# Patient Record
Sex: Male | Born: 1950 | Race: Black or African American | Hispanic: No | Smoking: Current every day smoker
Health system: Southern US, Community
[De-identification: ages and names within clinical notes are randomized; demographics above are authoritative.]

## PROBLEM LIST (undated history)

## (undated) DIAGNOSIS — I10 Essential (primary) hypertension: Secondary | ICD-10-CM

## (undated) DIAGNOSIS — F419 Anxiety disorder, unspecified: Secondary | ICD-10-CM

## (undated) DIAGNOSIS — R51 Headache: Secondary | ICD-10-CM

## (undated) DIAGNOSIS — Z8781 Personal history of (healed) traumatic fracture: Secondary | ICD-10-CM

## (undated) DIAGNOSIS — G8929 Other chronic pain: Secondary | ICD-10-CM

## (undated) DIAGNOSIS — M542 Cervicalgia: Secondary | ICD-10-CM

## (undated) DIAGNOSIS — R0602 Shortness of breath: Secondary | ICD-10-CM

## (undated) DIAGNOSIS — E785 Hyperlipidemia, unspecified: Secondary | ICD-10-CM

## (undated) DIAGNOSIS — M259 Joint disorder, unspecified: Secondary | ICD-10-CM

## (undated) DIAGNOSIS — R569 Unspecified convulsions: Secondary | ICD-10-CM

## (undated) DIAGNOSIS — F32A Depression, unspecified: Secondary | ICD-10-CM

## (undated) DIAGNOSIS — F329 Major depressive disorder, single episode, unspecified: Secondary | ICD-10-CM

## (undated) DIAGNOSIS — M1990 Unspecified osteoarthritis, unspecified site: Secondary | ICD-10-CM

## (undated) DIAGNOSIS — M549 Dorsalgia, unspecified: Secondary | ICD-10-CM

## (undated) HISTORY — DX: Other chronic pain: G89.29

## (undated) HISTORY — DX: Dorsalgia, unspecified: M54.9

## (undated) HISTORY — DX: Essential (primary) hypertension: I10

## (undated) HISTORY — DX: Unspecified osteoarthritis, unspecified site: M19.90

---

## 2006-11-09 ENCOUNTER — Emergency Department: Payer: Self-pay | Admitting: Emergency Medicine

## 2006-11-10 ENCOUNTER — Other Ambulatory Visit: Payer: Self-pay

## 2009-10-06 ENCOUNTER — Emergency Department: Payer: Self-pay | Admitting: Emergency Medicine

## 2012-01-13 ENCOUNTER — Ambulatory Visit: Payer: Self-pay | Admitting: Neurosurgery

## 2012-01-20 ENCOUNTER — Encounter (HOSPITAL_COMMUNITY): Payer: Self-pay | Admitting: Pharmacy Technician

## 2012-01-24 ENCOUNTER — Encounter (HOSPITAL_COMMUNITY): Payer: Self-pay | Admitting: Vascular Surgery

## 2012-01-24 ENCOUNTER — Encounter (HOSPITAL_COMMUNITY): Payer: Self-pay

## 2012-01-24 ENCOUNTER — Encounter (HOSPITAL_COMMUNITY)
Admission: RE | Admit: 2012-01-24 | Discharge: 2012-01-24 | Disposition: A | Discharge status: Home / Self Care | Payer: Medicaid Other | Source: Ambulatory Visit | Attending: Anesthesiology | Admitting: Anesthesiology

## 2012-01-24 ENCOUNTER — Encounter (HOSPITAL_COMMUNITY)
Admission: RE | Admit: 2012-01-24 | Discharge: 2012-01-24 | Disposition: A | Discharge status: Home / Self Care | Payer: Medicaid Other | Source: Ambulatory Visit | Attending: Neurosurgery | Admitting: Neurosurgery

## 2012-01-24 ENCOUNTER — Other Ambulatory Visit: Payer: Self-pay

## 2012-01-24 ENCOUNTER — Other Ambulatory Visit (HOSPITAL_COMMUNITY): Payer: Self-pay | Admitting: *Deleted

## 2012-01-24 DIAGNOSIS — Z01812 Encounter for preprocedural laboratory examination: Secondary | ICD-10-CM | POA: Insufficient documentation

## 2012-01-24 DIAGNOSIS — Z01810 Encounter for preprocedural cardiovascular examination: Secondary | ICD-10-CM | POA: Insufficient documentation

## 2012-01-24 HISTORY — DX: Unspecified convulsions: R56.9

## 2012-01-24 HISTORY — DX: Anxiety disorder, unspecified: F41.9

## 2012-01-24 HISTORY — DX: Shortness of breath: R06.02

## 2012-01-24 HISTORY — DX: Personal history of (healed) traumatic fracture: Z87.81

## 2012-01-24 HISTORY — DX: Depression, unspecified: F32.A

## 2012-01-24 HISTORY — DX: Major depressive disorder, single episode, unspecified: F32.9

## 2012-01-24 LAB — COMPREHENSIVE METABOLIC PANEL
Albumin: 3.9 g/dL (ref 3.5–5.2)
BUN: 11 mg/dL (ref 6–23)
Calcium: 10.1 mg/dL (ref 8.4–10.5)
GFR calc Af Amer: 83 mL/min — ABNORMAL LOW (ref >90)
Glucose, Bld: 85 mg/dL (ref 70–99)
Sodium: 139 mEq/L (ref 135–145)
Total Protein: 7.8 g/dL (ref 6.0–8.3)

## 2012-01-24 LAB — CBC
MCH: 31.6 pg (ref 26.0–34.0)
MCHC: 34.6 g/dL (ref 30.0–36.0)
MCV: 91.5 fL (ref 78.0–100.0)
Platelets: 224 10*3/uL (ref 150–400)
RBC: 4.93 MIL/uL (ref 4.22–5.81)

## 2012-01-24 LAB — TYPE AND SCREEN: ABO/RH(D): A POS

## 2012-01-24 LAB — SURGICAL PCR SCREEN
MRSA, PCR: NEGATIVE
Staphylococcus aureus: NEGATIVE

## 2012-01-24 LAB — ABO/RH: ABO/RH(D): A POS

## 2012-01-24 LAB — DIFFERENTIAL
Basophils Relative: 0 % (ref 0–1)
Eosinophils Absolute: 0.1 10*3/uL (ref 0.0–0.7)
Eosinophils Relative: 1 % (ref 0–5)
Lymphs Abs: 2.1 10*3/uL (ref 0.7–4.0)

## 2012-01-24 MED ORDER — CEFAZOLIN SODIUM 1-5 GM-% IV SOLN: 1.0000 g | INTRAVENOUS | Status: DC

## 2012-01-24 MED ORDER — DEXAMETHASONE SODIUM PHOSPHATE 10 MG/ML IJ SOLN: 10.0000 mg | Freq: Once | INTRAMUSCULAR | Status: DC

## 2012-01-24 NOTE — Pre-Procedure Instructions (Signed)
20 Marque Rademaker  01/24/2012   Your procedure is scheduled on:  Tuesday, February 19th.  Report to [facility] at 7:30 AM.  Call this number if you have problems the morning of surgery: [phone]   Remember:   Do not eat food:After Midnight.  May have clear liquids: up to 4 Hours before arrival.  Clear liquids include soda, tea, black coffee, apple or grape juice, broth.   Take these medicines the morning of surgery with A SIP OF WATER: NA   Do not wear jewelry, make-up or nail polish.  Do not wear lotions, powders, or perfumes. You may wear deodorant.  Do not shave 48 hours prior to surgery.  Do not bring valuables to the hospital.  Contacts, dentures or bridgework may not be worn into surgery.  Leave suitcase in the car. After surgery it may be brought to your room.  For patients admitted to the hospital, checkout time is 11:00 AM the day of discharge.   Patients discharged the day of surgery will not be allowed to drive home.  Name and phone number of your driver: ---  Special Instructions: CHG Shower Use Special Wash: 1/2 bottle night before surgery and 1/2 bottle morning of surgery.   Please read over the following fact sheets that you were given: Pain Booklet, Coughing and Deep Breathing, Blood Transfusion Information, MRSA Information and Surgical Site Infection Prevention

## 2012-01-24 NOTE — Consult Note (Signed)
Anesthesia:  Patient is a 61 year old male scheduled for a C7-T1 laminectomy on 01/25/12.  He is experiencing severe RUE and neck pain.  His PAT appointment was today (01/24/12).  His history includes HTN, SOB, depression, anxiety, seizures, GERD, smoking, and history of near syncope which he says is related to pain.  He also told his PAT nurse that he had experienced some chest pain recently.  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  He denies any prior EKGs or cardiac testing.  His PCP is Dr. Denita Lung at [facility] [phone]).  He describes a episode of sharp left sided chest pain while sitting a couple of weeks ago that last 5-8 seconds.  He had no associated symptoms such as diaphoresis, nausea, palpitations, or pre-syncope at that time.  He has not had a similar pain before or since.  He has no know history of MI/CAD/CHF/DM or family history of CAD.  He is not particularly active due to pain.  He does like to fish.  He can not walk more than 1-2 block before having to rest due to SOB.  This has been his baseline for at least a year.    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  He was last seen by his Neurologist Dr. Pearletha Alfred in July 2012.  He was taken off Dilantin at that time.  Mr. Kimrey denies have any seizures since.  He did report two episodes of pre-syncope within the last three months however, that were associated with pain.  Exam today shows lungs clear, diminished.  Heart with a RRR, no murmur auscultated.  No carotid bruits or LE edema noted.  I reviewed above with Dr. Fran Lowes who recommends further Cardiac work-up pre-operatively.  Erie Noe at Dr. Lindalou Hose office notified.  She will contact Mr. Ohalloran once further evaluation is arranged with thru Dr. Fran Lowes or thru Cardiology referral.  Patient aware.

## 2012-01-25 ENCOUNTER — Ambulatory Visit (HOSPITAL_COMMUNITY): Admission: RE | Admit: 2012-01-25 | Payer: Medicaid Other | Source: Ambulatory Visit | Admitting: Neurosurgery

## 2012-01-25 ENCOUNTER — Encounter (HOSPITAL_COMMUNITY): Admission: RE | Payer: Self-pay | Source: Ambulatory Visit

## 2012-01-25 SURGERY — POSTERIOR CERVICAL LAMINECTOMY
Anesthesia: General | Laterality: Right

## 2012-02-18 ENCOUNTER — Encounter: Payer: Self-pay | Admitting: Cardiology

## 2012-02-23 ENCOUNTER — Ambulatory Visit (INDEPENDENT_AMBULATORY_CARE_PROVIDER_SITE_OTHER): Payer: Medicaid Other | Admitting: Cardiology

## 2012-02-23 ENCOUNTER — Encounter: Payer: Self-pay | Admitting: Cardiology

## 2012-02-23 VITALS — BP 151/86 | HR 62 | Resp 16 | Ht 68.0 in | Wt 150.0 lb

## 2012-02-23 DIAGNOSIS — Z720 Tobacco use: Secondary | ICD-10-CM | POA: Insufficient documentation

## 2012-02-23 DIAGNOSIS — I10 Essential (primary) hypertension: Secondary | ICD-10-CM | POA: Insufficient documentation

## 2012-02-23 DIAGNOSIS — Z01818 Encounter for other preprocedural examination: Secondary | ICD-10-CM | POA: Insufficient documentation

## 2012-02-23 DIAGNOSIS — R9431 Abnormal electrocardiogram [ECG] [EKG]: Secondary | ICD-10-CM | POA: Insufficient documentation

## 2012-02-23 DIAGNOSIS — F17200 Nicotine dependence, unspecified, uncomplicated: Secondary | ICD-10-CM

## 2012-02-23 DIAGNOSIS — R0602 Shortness of breath: Secondary | ICD-10-CM | POA: Insufficient documentation

## 2012-02-23 NOTE — Assessment & Plan Note (Signed)
Blood pressure up today. He states of compliance with his medication. Also discussed diet and sodium restriction.

## 2012-02-23 NOTE — Assessment & Plan Note (Signed)
This may be relatively nonspecific finding - further ischemic workup planned.

## 2012-02-23 NOTE — Assessment & Plan Note (Signed)
Smoking cessation discussed 

## 2012-02-23 NOTE — Assessment & Plan Note (Signed)
Patient with history of hypertension, abnormal ECG as noted, tobacco abuse, some family history of premature CAD. He is being considered for cervical laminectomy with Dr. Jordan Likes. He has had some atypical chest pains and chronic dyspnea on exertion. Plan is to further assess with a Lexiscan Myoview and echocardiogram. Can then make final recommendations regarding surgical risk.

## 2012-02-23 NOTE — Patient Instructions (Signed)
**Note De-Identified Jaelyn Cloninger Obfuscation** Your physician recommends that you continue on your current medications as directed. Please refer to the Current Medication list given to you today.  Your physician has requested that you have an echocardiogram. Echocardiography is a painless test that uses sound waves to create images of your heart. It provides your doctor with information about the size and shape of your heart and how well your heart's chambers and valves are working. This procedure takes approximately one hour. There are no restrictions for this procedure.  Your physician has requested that you have a lexiscan myoview. For further information please visit [email or web address] Please follow instruction sheet, as given.  Your physician recommends that you schedule a follow-up appointment in: we will contact you with results of test.

## 2012-02-23 NOTE — Assessment & Plan Note (Signed)
Chronic by description. Echocardiogram is being arranged.

## 2012-02-23 NOTE — Progress Notes (Signed)
   Clinical Summary Arthur Wilcox is a 61 y.o.male referred for cardiology consultation by Arthur Wilcox. He is undergoing preoperative evaluation prior to cervical laminectomy with Dr. Jordan Likes. Surgery has not yet been scheduled.  He reports no history of CAD or MI. ECG is reviewed below. He does state that he has had some episodes of chest pain over the last 8 months, not exertional, and typically brief. Mild to moderate, sometimes sharp. Also NYHA class 2-3 dyspnea on exertion that is chronic. He has a longstanding tobacco use history.  He has not undergone any prior ischemic testing. He reports compliance with his medications.  No Known Allergies  Current Outpatient Prescriptions  Medication Sig Dispense Refill  . ibuprofen (ADVIL,MOTRIN) 600 MG tablet Take 600 mg by mouth 3 (three) times daily. For pain      . lisinopril (PRINIVIL,ZESTRIL) 10 MG tablet Take 10 mg by mouth daily.        Past Medical History  Diagnosis Date  . Essential hypertension, benign   . Seizures     No recent history by report  . Anxiety   . Depression   . GERD (gastroesophageal reflux disease)   . Osteoarthritis   . History of foot fracture   . Chronic back pain     History reviewed. No pertinent past surgical history.  Family History  Problem Relation Age of Onset  . Hypertension Mother   . Stroke Mother   . Diabetes type II Brother   . Coronary artery disease Brother   . Diabetes type II Sister     Social History Arthur Wilcox reports that he has been smoking Cigarettes and Cigars.  He has smoked for the past 44 years. He has never used smokeless tobacco. Arthur Wilcox reports that he drinks about .6 ounces of alcohol per week.  Review of Systems No palpitations or syncope. No reported bleeding problems. Chronic back pain, limited ambulation. No recent falls or seizures. Sometimes tingling in fingers. Otherwise negative.  Physical Examination Filed Vitals:   02/23/12 1014  BP: 151/86  Pulse: 62  Resp: 16     Normally nourished appearing, NAD. HEENT: Conjunctiva and lids normal, oropharynx clear. Neck: Supple, no elevated JVP or carotid bruits, no thyromegaly. Lungs: Clear to auscultation, nonlabored breathing at rest. Cardiac: Regular rate and rhythm, somewhat distant, no S3 with soft systolic murmur, no pericardial rub. Abdomen: Soft, nontender, bowel sounds present, no guarding or rebound. Extremities: No pitting edema, distal pulses 1-2+. Skin: Warm and dry. Musculoskeletal: No kyphosis. Neuropsychiatric: Alert and oriented x3, affect grossly appropriate.   ECG Normal sinus rhythm with possible anteroseptal Q waves.    Problem List and Plan

## 2012-02-29 ENCOUNTER — Encounter (HOSPITAL_COMMUNITY)
Admission: RE | Admit: 2012-02-29 | Discharge: 2012-02-29 | Disposition: A | Discharge status: Home / Self Care | Payer: Medicaid Other | Source: Ambulatory Visit | Attending: Cardiology | Admitting: Cardiology

## 2012-02-29 ENCOUNTER — Ambulatory Visit (HOSPITAL_COMMUNITY)
Admission: RE | Admit: 2012-02-29 | Discharge: 2012-02-29 | Disposition: A | Discharge status: Home / Self Care | Payer: Medicaid Other | Source: Ambulatory Visit | Attending: Cardiology | Admitting: Cardiology

## 2012-02-29 ENCOUNTER — Ambulatory Visit (INDEPENDENT_AMBULATORY_CARE_PROVIDER_SITE_OTHER): Payer: Medicaid Other

## 2012-02-29 DIAGNOSIS — I059 Rheumatic mitral valve disease, unspecified: Secondary | ICD-10-CM

## 2012-02-29 DIAGNOSIS — R079 Chest pain, unspecified: Secondary | ICD-10-CM | POA: Insufficient documentation

## 2012-02-29 DIAGNOSIS — R9431 Abnormal electrocardiogram [ECG] [EKG]: Secondary | ICD-10-CM | POA: Insufficient documentation

## 2012-02-29 DIAGNOSIS — R0602 Shortness of breath: Secondary | ICD-10-CM

## 2012-02-29 DIAGNOSIS — I10 Essential (primary) hypertension: Secondary | ICD-10-CM | POA: Insufficient documentation

## 2012-02-29 DIAGNOSIS — R0609 Other forms of dyspnea: Secondary | ICD-10-CM | POA: Insufficient documentation

## 2012-02-29 DIAGNOSIS — R0989 Other specified symptoms and signs involving the circulatory and respiratory systems: Secondary | ICD-10-CM | POA: Insufficient documentation

## 2012-02-29 DIAGNOSIS — Z01810 Encounter for preprocedural cardiovascular examination: Secondary | ICD-10-CM | POA: Insufficient documentation

## 2012-02-29 DIAGNOSIS — Z01818 Encounter for other preprocedural examination: Secondary | ICD-10-CM

## 2012-02-29 DIAGNOSIS — F17200 Nicotine dependence, unspecified, uncomplicated: Secondary | ICD-10-CM | POA: Insufficient documentation

## 2012-02-29 MED ORDER — TECHNETIUM TC 99M TETROFOSMIN IV KIT
30.0000 | PACK | Freq: Once | INTRAVENOUS | Status: AC | PRN
  Administered 2012-02-29: 32 via INTRAVENOUS

## 2012-02-29 MED ORDER — TECHNETIUM TC 99M TETROFOSMIN IV KIT
10.0000 | PACK | Freq: Once | INTRAVENOUS | Status: AC | PRN
  Administered 2012-02-29: 10.5 via INTRAVENOUS

## 2012-02-29 NOTE — Progress Notes (Signed)
Stress Lab Nurses Notes - Layth Cerezo 02/29/2012  Reason for doing test: Chest Pain and Surgical Clearance Type of test: Steffanie Dunn Nurse performing test: Parke Poisson, RN Nuclear Medicine Tech: Lou Cal Echo Tech: Not Applicable MD performing test: R. Rothbart Family MD: Fran Lowes Test explained and consent signed: yes IV started: 22g jelco, Saline lock flushed, No redness or edema and Saline lock started in radiology Symptoms: Dizziness Treatment/Intervention: None Reason test stopped: protocol completed After recovery IV was: Discontinued via X-ray tech and No redness or edema Patient to return to Nuc. Med at : 12:30 Patient discharged: Home Patient's Condition upon discharge was: stable Comments: During test BP 118/62 & HR 86.  Recovery BP 120/68 & HR 70.  Symptoms resolved in recovery. Erskine Speed T

## 2012-02-29 NOTE — Progress Notes (Signed)
*  PRELIMINARY RESULTS* Echocardiogram 2D Echocardiogram has been performed.  Arthur Wilcox 02/29/2012, 8:07 AM

## 2012-03-31 ENCOUNTER — Other Ambulatory Visit: Payer: Self-pay | Admitting: Neurosurgery

## 2012-04-04 ENCOUNTER — Encounter (HOSPITAL_COMMUNITY): Payer: Self-pay | Admitting: Pharmacy Technician

## 2012-04-10 ENCOUNTER — Encounter (HOSPITAL_COMMUNITY)
Admission: RE | Admit: 2012-04-10 | Discharge: 2012-04-10 | Disposition: A | Discharge status: Home / Self Care | Payer: Medicaid Other | Source: Ambulatory Visit | Attending: Neurosurgery | Admitting: Neurosurgery

## 2012-04-10 ENCOUNTER — Encounter (HOSPITAL_COMMUNITY): Payer: Self-pay

## 2012-04-10 HISTORY — DX: Other chronic pain: G89.29

## 2012-04-10 HISTORY — DX: Headache: R51

## 2012-04-10 HISTORY — DX: Cervicalgia: M54.2

## 2012-04-10 HISTORY — DX: Hyperlipidemia, unspecified: E78.5

## 2012-04-10 HISTORY — DX: Joint disorder, unspecified: M25.9

## 2012-04-10 LAB — TYPE AND SCREEN
ABO/RH(D): A POS
Antibody Screen: NEGATIVE

## 2012-04-10 LAB — DIFFERENTIAL
Basophils Absolute: 0 10*3/uL (ref 0.0–0.1)
Basophils Relative: 0 % (ref 0–1)
Eosinophils Absolute: 0.1 10*3/uL (ref 0.0–0.7)
Eosinophils Relative: 1 % (ref 0–5)
Lymphocytes Relative: 36 % (ref 12–46)
Lymphs Abs: 2.1 10*3/uL (ref 0.7–4.0)
Monocytes Absolute: 0.4 10*3/uL (ref 0.1–1.0)
Monocytes Relative: 7 % (ref 3–12)
Neutro Abs: 3.2 10*3/uL (ref 1.7–7.7)
Neutrophils Relative %: 56 % (ref 43–77)

## 2012-04-10 LAB — COMPREHENSIVE METABOLIC PANEL
ALT: 9 U/L (ref 0–53)
AST: 18 U/L (ref 0–37)
Albumin: 3.7 g/dL (ref 3.5–5.2)
Alkaline Phosphatase: 63 U/L (ref 39–117)
CO2: 28 mEq/L (ref 19–32)
Chloride: 103 mEq/L (ref 96–112)
Creatinine, Ser: 1.07 mg/dL (ref 0.50–1.35)
GFR calc non Af Amer: 75 mL/min — ABNORMAL LOW (ref >90)
Potassium: 4.6 mEq/L (ref 3.5–5.1)
Sodium: 139 mEq/L (ref 135–145)
Total Bilirubin: 0.2 mg/dL — ABNORMAL LOW (ref 0.3–1.2)

## 2012-04-10 LAB — CBC
HCT: 42 % (ref 39.0–52.0)
Hemoglobin: 14.4 g/dL (ref 13.0–17.0)
MCH: 31.4 pg (ref 26.0–34.0)
MCHC: 34.3 g/dL (ref 30.0–36.0)
MCV: 91.7 fL (ref 78.0–100.0)
Platelets: 228 10*3/uL (ref 150–400)
RBC: 4.58 MIL/uL (ref 4.22–5.81)
RDW: 13.5 % (ref 11.5–15.5)
WBC: 5.8 10*3/uL (ref 4.0–10.5)

## 2012-04-10 LAB — SURGICAL PCR SCREEN
MRSA, PCR: NEGATIVE
Staphylococcus aureus: NEGATIVE

## 2012-04-10 NOTE — Pre-Procedure Instructions (Signed)
20 Arthur Wilcox  04/10/2012   Your procedure is scheduled on: Tues, May 14 @ 11:00 AM  Report to [facility] at 9:00 AM.  Call this number if you have problems the morning of surgery: [phone]   Remember:   Do not eat food:After Midnight.  May have clear liquids: up to 4 Hours before arrival.(until 5:00 am)  Clear liquids include soda, tea, black coffee, apple or grape juice, broth.    Do not wear jewelry  Do not wear lotions, powders, or perfumes  Do not bring valuables to the hospital.  Contacts, dentures or bridgework may not be worn into surgery.  Leave suitcase in the car. After surgery it may be brought to your room.  For patients admitted to the hospital, checkout time is 11:00 AM the day of discharge.   Special Instructions: CHG Shower Use Special Wash: 1/2 bottle night before surgery and 1/2 bottle morning of surgery.   Please read over the following fact sheets that you were given: Pain Booklet, Coughing and Deep Breathing, Blood Transfusion Information, MRSA Information and Surgical Site Infection Prevention

## 2012-04-10 NOTE — Consult Note (Signed)
Anesthesia Chart Review:  Patient is a 61 year old male scheduled for a right C7-T1 laminectomy, foraminotomy, microdiskectomy on 04/18/12.  He was initially scheduled for this procedure on 01/25/12, but during his PAT visit he reported episodes of left sided chest pain and presyncope related to severe RUE and neck pain.  I reviewed this with his PCP Dr. Denita Lung who agreed that surgery should be postponed until he could be evaluated by Cardiology.  Since that visit, he has been evaluated by Cardiologist Dr. Diona Browner on 02/23/12 who recommended a stress and echo.  Echo on 02/29/12 showed:   - Left ventricle: The cavity size was normal. Wall thickness was increased in a pattern of mild LVH. Systolic function was normal. The estimated ejection fraction was in the range of 50% to 55%. Wall motion was normal; there were no regional wall motion abnormalities. Doppler parameters are consistent with abnormal left ventricular relaxation (grade 1 diastolic dysfunction). - Aortic valve: Trileaflet; mildly thickened leaflets. - Mitral valve: Mildly thickened leaflets . Mild regurgitation. - Tricuspid valve: Trivial regurgitation. - Pericardium, extracardiac: There was no pericardial Effusion.  His nuclear stress test impression from 02/29/12 was: "Probably negative pharmacologic stress nuclear myocardial study revealing no stress induced EKG abnormalities, normal left ventricular size and mild global reduction in left ventricular systolic function. By scintigraphic imaging, data from visual image analysis conflicted with that obtained from numeric assessment. Apparent perfusion abnormalities probably reflect technical issues concerning image normalization; however, the possibility of mild ischemia in the distribution of the left anterior descending coronary artery cannot be unequivocally excluded."  EF was 43%.  This was ultimately reviewed by Dr. Diona Browner and felt patient was at acceptable risk for surgery.   (See his comments under Results Review tab.)  EKG on 02/23/12 showed NSR, possible septal infarct (age undetermined).  He had a negative CXR on 01/24/12.  Labs noted.  Anticipate he can proceed if no significant change in his status.  Shonna Chock, PA-C    Other history includes HTN, SOB, depression, anxiety, GERD, OA, migraines, chronic neck and back pain, HLD, smoking.  He also has a history of seizures. When I spoke with him in February 2013 he reported that his history of seizures starting around age 53 after a "sun stroke". He was last seen by his Neurologist Dr. Pearletha Alfred in July 2012. He was taken off Dilantin at that time and denied any seizures since.   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 He denies any prior EKGs or cardiac testing. His PCP is Dr. Denita Lung at [facility] [phone]).  He describes a episode of sharp left sided chest pain while sitting a couple of weeks ago that last 5-8 seconds. He had no associated symptoms such as diaphoresis, nausea, palpitations, or pre-syncope at that time. He has not had a similar pain before or since. He has no know history of MI/CAD/CHF/DM or family history of CAD. He is not particularly active due to pain. He does like to fish. He can not walk more than 1-2 block before having to rest due to SOB. This has been his baseline for at least a year.  He also reports a  He did report two episodes of pre-syncope within the last three months however, that were associated with pain.

## 2012-04-10 NOTE — Progress Notes (Signed)
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

## 2012-04-10 NOTE — Progress Notes (Signed)
Right lower leg scabbed over from a tick bite a week ago

## 2012-04-17 MED ORDER — CEFAZOLIN SODIUM 1-5 GM-% IV SOLN
1.0000 g | INTRAVENOUS | Status: AC
  Administered 2012-04-18: 1 g via INTRAVENOUS
  Filled 2012-04-17: qty 50

## 2012-04-18 ENCOUNTER — Encounter (HOSPITAL_COMMUNITY)
Admission: RE | Disposition: A | Discharge status: Home / Self Care | Payer: Self-pay | Source: Ambulatory Visit | Attending: Neurosurgery

## 2012-04-18 ENCOUNTER — Ambulatory Visit (HOSPITAL_COMMUNITY)
Admission: RE | Admit: 2012-04-18 | Discharge: 2012-04-19 | Disposition: A | Discharge status: Home / Self Care | Payer: Medicaid Other | Source: Ambulatory Visit | Attending: Neurosurgery | Admitting: Neurosurgery

## 2012-04-18 ENCOUNTER — Ambulatory Visit (HOSPITAL_COMMUNITY): Payer: Medicaid Other

## 2012-04-18 ENCOUNTER — Encounter (HOSPITAL_COMMUNITY): Payer: Self-pay | Admitting: *Deleted

## 2012-04-18 ENCOUNTER — Ambulatory Visit (HOSPITAL_COMMUNITY): Payer: Medicaid Other | Admitting: Vascular Surgery

## 2012-04-18 ENCOUNTER — Encounter (HOSPITAL_COMMUNITY): Payer: Self-pay | Admitting: Vascular Surgery

## 2012-04-18 DIAGNOSIS — F17200 Nicotine dependence, unspecified, uncomplicated: Secondary | ICD-10-CM | POA: Insufficient documentation

## 2012-04-18 DIAGNOSIS — Z01812 Encounter for preprocedural laboratory examination: Secondary | ICD-10-CM | POA: Insufficient documentation

## 2012-04-18 DIAGNOSIS — M5010 Cervical disc disorder with radiculopathy, unspecified cervical region: Secondary | ICD-10-CM | POA: Diagnosis present

## 2012-04-18 DIAGNOSIS — M5020 Other cervical disc displacement, unspecified cervical region: Secondary | ICD-10-CM | POA: Insufficient documentation

## 2012-04-18 DIAGNOSIS — M129 Arthropathy, unspecified: Secondary | ICD-10-CM | POA: Insufficient documentation

## 2012-04-18 DIAGNOSIS — I10 Essential (primary) hypertension: Secondary | ICD-10-CM | POA: Insufficient documentation

## 2012-04-18 DIAGNOSIS — M1990 Unspecified osteoarthritis, unspecified site: Secondary | ICD-10-CM | POA: Insufficient documentation

## 2012-04-18 DIAGNOSIS — K08109 Complete loss of teeth, unspecified cause, unspecified class: Secondary | ICD-10-CM | POA: Insufficient documentation

## 2012-04-18 HISTORY — PX: POSTERIOR CERVICAL LAMINECTOMY: SHX2248

## 2012-04-18 SURGERY — POSTERIOR CERVICAL LAMINECTOMY
Anesthesia: General | Site: Spine Cervical | Laterality: Right | Wound class: Clean

## 2012-04-18 MED ORDER — OXYCODONE-ACETAMINOPHEN 5-325 MG PO TABS
1.0000 | ORAL_TABLET | ORAL | Status: DC | PRN
  Administered 2012-04-18 – 2012-04-19 (×2): 2 via ORAL
  Filled 2012-04-18 (×2): qty 2

## 2012-04-18 MED ORDER — THROMBIN 5000 UNITS EX KIT
PACK | CUTANEOUS | Status: DC | PRN
  Administered 2012-04-18 (×2): 5000 [IU] via TOPICAL

## 2012-04-18 MED ORDER — HYDROMORPHONE HCL PF 1 MG/ML IJ SOLN
INTRAMUSCULAR | Status: AC
  Filled 2012-04-18: qty 1

## 2012-04-18 MED ORDER — MENTHOL 3 MG MT LOZG: 1.0000 | LOZENGE | OROMUCOSAL | Status: DC | PRN

## 2012-04-18 MED ORDER — SENNA 8.6 MG PO TABS
1.0000 | ORAL_TABLET | Freq: Two times a day (BID) | ORAL | Status: DC
  Administered 2012-04-18 – 2012-04-19 (×2): 8.6 mg via ORAL
  Filled 2012-04-18 (×3): qty 1

## 2012-04-18 MED ORDER — LISINOPRIL 10 MG PO TABS
10.0000 mg | ORAL_TABLET | Freq: Every day | ORAL | Status: DC
  Administered 2012-04-19: 10 mg via ORAL
  Filled 2012-04-18: qty 1

## 2012-04-18 MED ORDER — CYCLOBENZAPRINE HCL 10 MG PO TABS
10.0000 mg | ORAL_TABLET | Freq: Three times a day (TID) | ORAL | Status: DC | PRN
  Administered 2012-04-18: 10 mg via ORAL
  Filled 2012-04-18: qty 1

## 2012-04-18 MED ORDER — DEXAMETHASONE SODIUM PHOSPHATE 10 MG/ML IJ SOLN: 10.0000 mg | INTRAMUSCULAR | Status: DC

## 2012-04-18 MED ORDER — SODIUM CHLORIDE 0.9 % IJ SOLN: 3.0000 mL | INTRAMUSCULAR | Status: DC | PRN

## 2012-04-18 MED ORDER — PHENOL 1.4 % MT LIQD: 1.0000 | OROMUCOSAL | Status: DC | PRN

## 2012-04-18 MED ORDER — GLYCOPYRROLATE 0.2 MG/ML IJ SOLN
INTRAMUSCULAR | Status: DC | PRN
  Administered 2012-04-18: .5 mg via INTRAVENOUS

## 2012-04-18 MED ORDER — ALUM & MAG HYDROXIDE-SIMETH 200-200-20 MG/5ML PO SUSP: 30.0000 mL | Freq: Four times a day (QID) | ORAL | Status: DC | PRN

## 2012-04-18 MED ORDER — LACTATED RINGERS IV SOLN
INTRAVENOUS | Status: DC | PRN
  Administered 2012-04-18 (×2): via INTRAVENOUS

## 2012-04-18 MED ORDER — SODIUM CHLORIDE 0.9 % IR SOLN
Status: DC | PRN
  Administered 2012-04-18: 14:00:00

## 2012-04-18 MED ORDER — PROMETHAZINE HCL 25 MG/ML IJ SOLN: 6.2500 mg | INTRAMUSCULAR | Status: DC | PRN

## 2012-04-18 MED ORDER — VECURONIUM BROMIDE 10 MG IV SOLR
INTRAVENOUS | Status: DC | PRN
  Administered 2012-04-18: 9 mg via INTRAVENOUS

## 2012-04-18 MED ORDER — NEOSTIGMINE METHYLSULFATE 1 MG/ML IJ SOLN
INTRAMUSCULAR | Status: DC | PRN
  Administered 2012-04-18: 3 mg via INTRAVENOUS

## 2012-04-18 MED ORDER — BACITRACIN 50000 UNITS IM SOLR
INTRAMUSCULAR | Status: AC
  Filled 2012-04-18: qty 1

## 2012-04-18 MED ORDER — BISACODYL 10 MG RE SUPP: 10.0000 mg | Freq: Every day | RECTAL | Status: DC | PRN

## 2012-04-18 MED ORDER — SODIUM CHLORIDE 0.9 % IV SOLN
INTRAVENOUS | Status: AC
  Filled 2012-04-18: qty 500

## 2012-04-18 MED ORDER — ZOLPIDEM TARTRATE 5 MG PO TABS: 10.0000 mg | ORAL_TABLET | Freq: Every evening | ORAL | Status: DC | PRN

## 2012-04-18 MED ORDER — KETOROLAC TROMETHAMINE 30 MG/ML IJ SOLN
30.0000 mg | Freq: Four times a day (QID) | INTRAMUSCULAR | Status: DC
  Administered 2012-04-18 – 2012-04-19 (×3): 30 mg via INTRAVENOUS
  Filled 2012-04-18 (×7): qty 1

## 2012-04-18 MED ORDER — SODIUM CHLORIDE 0.9 % IV SOLN: 250.0000 mL | INTRAVENOUS | Status: DC

## 2012-04-18 MED ORDER — ONDANSETRON HCL 4 MG/2ML IJ SOLN: 4.0000 mg | INTRAMUSCULAR | Status: DC | PRN

## 2012-04-18 MED ORDER — FLEET ENEMA 7-19 GM/118ML RE ENEM
1.0000 | ENEMA | Freq: Once | RECTAL | Status: AC | PRN
  Filled 2012-04-18: qty 1

## 2012-04-18 MED ORDER — MIDAZOLAM HCL 5 MG/5ML IJ SOLN
INTRAMUSCULAR | Status: DC | PRN
  Administered 2012-04-18: 2 mg via INTRAVENOUS

## 2012-04-18 MED ORDER — MEPERIDINE HCL 25 MG/ML IJ SOLN: 6.2500 mg | INTRAMUSCULAR | Status: DC | PRN

## 2012-04-18 MED ORDER — MIDAZOLAM HCL 2 MG/2ML IJ SOLN: 0.5000 mg | Freq: Once | INTRAMUSCULAR | Status: DC | PRN

## 2012-04-18 MED ORDER — HEMOSTATIC AGENTS (NO CHARGE) OPTIME
TOPICAL | Status: DC | PRN
  Administered 2012-04-18: 1 via TOPICAL

## 2012-04-18 MED ORDER — 0.9 % SODIUM CHLORIDE (POUR BTL) OPTIME
TOPICAL | Status: DC | PRN
  Administered 2012-04-18: 1000 mL

## 2012-04-18 MED ORDER — BUPIVACAINE HCL (PF) 0.25 % IJ SOLN
INTRAMUSCULAR | Status: DC | PRN
  Administered 2012-04-18: 20 mL

## 2012-04-18 MED ORDER — BACITRACIN ZINC 500 UNIT/GM EX OINT
TOPICAL_OINTMENT | CUTANEOUS | Status: DC | PRN
  Administered 2012-04-18: 1 via TOPICAL

## 2012-04-18 MED ORDER — ONDANSETRON HCL 4 MG/2ML IJ SOLN
INTRAMUSCULAR | Status: DC | PRN
  Administered 2012-04-18: 4 mg via INTRAVENOUS

## 2012-04-18 MED ORDER — HYDROMORPHONE HCL PF 1 MG/ML IJ SOLN
0.2500 mg | INTRAMUSCULAR | Status: DC | PRN
  Administered 2012-04-18 (×4): 0.5 mg via INTRAVENOUS

## 2012-04-18 MED ORDER — CEFAZOLIN SODIUM 1-5 GM-% IV SOLN
1.0000 g | Freq: Three times a day (TID) | INTRAVENOUS | Status: AC
  Administered 2012-04-18 – 2012-04-19 (×2): 1 g via INTRAVENOUS
  Filled 2012-04-18 (×2): qty 50

## 2012-04-18 MED ORDER — ACETAMINOPHEN 650 MG RE SUPP: 650.0000 mg | RECTAL | Status: DC | PRN

## 2012-04-18 MED ORDER — SODIUM CHLORIDE 0.9 % IJ SOLN
3.0000 mL | Freq: Two times a day (BID) | INTRAMUSCULAR | Status: DC
  Administered 2012-04-18: 3 mL via INTRAVENOUS

## 2012-04-18 MED ORDER — PROPOFOL 10 MG/ML IV EMUL
INTRAVENOUS | Status: DC | PRN
  Administered 2012-04-18: 150 mg via INTRAVENOUS
  Administered 2012-04-18: 50 mg via INTRAVENOUS

## 2012-04-18 MED ORDER — HYDROCODONE-ACETAMINOPHEN 5-325 MG PO TABS: 1.0000 | ORAL_TABLET | ORAL | Status: DC | PRN

## 2012-04-18 MED ORDER — HYDROMORPHONE HCL PF 1 MG/ML IJ SOLN: 0.5000 mg | INTRAMUSCULAR | Status: DC | PRN

## 2012-04-18 MED ORDER — POLYETHYLENE GLYCOL 3350 17 G PO PACK
17.0000 g | PACK | Freq: Every day | ORAL | Status: DC | PRN
  Filled 2012-04-18: qty 1

## 2012-04-18 MED ORDER — SUFENTANIL CITRATE 50 MCG/ML IV SOLN
INTRAVENOUS | Status: DC | PRN
  Administered 2012-04-18: 20 ug via INTRAVENOUS
  Administered 2012-04-18 (×2): 10 ug via INTRAVENOUS

## 2012-04-18 MED ORDER — ACETAMINOPHEN 325 MG PO TABS: 650.0000 mg | ORAL_TABLET | ORAL | Status: DC | PRN

## 2012-04-18 MED ORDER — DEXAMETHASONE SODIUM PHOSPHATE 10 MG/ML IJ SOLN
INTRAMUSCULAR | Status: AC
  Administered 2012-04-18: 10 mg via INTRAVENOUS
  Filled 2012-04-18: qty 1

## 2012-04-18 MED ORDER — KETOROLAC TROMETHAMINE 30 MG/ML IJ SOLN
INTRAMUSCULAR | Status: DC | PRN
  Administered 2012-04-18: 30 mg via INTRAVENOUS

## 2012-04-18 SURGICAL SUPPLY — 55 items
BAG DECANTER FOR FLEXI CONT (MISCELLANEOUS) ×2 IMPLANT
BENZOIN TINCTURE PRP APPL 2/3 (GAUZE/BANDAGES/DRESSINGS) ×2 IMPLANT
BLADE SURG ROTATE 9660 (MISCELLANEOUS) ×2 IMPLANT
BRUSH SCRUB EZ PLAIN DRY (MISCELLANEOUS) ×2 IMPLANT
BUR MATCHSTICK NEURO 3.0 LAGG (BURR) ×2 IMPLANT
CANISTER SUCTION 2500CC (MISCELLANEOUS) ×2 IMPLANT
CLOTH BEACON ORANGE TIMEOUT ST (SAFETY) ×2 IMPLANT
CONT SPEC 4OZ CLIKSEAL STRL BL (MISCELLANEOUS) ×2 IMPLANT
DERMABOND ADHESIVE PROPEN (GAUZE/BANDAGES/DRESSINGS) ×1
DERMABOND ADVANCED (GAUZE/BANDAGES/DRESSINGS)
DERMABOND ADVANCED .7 DNX12 (GAUZE/BANDAGES/DRESSINGS) IMPLANT
DERMABOND ADVANCED .7 DNX6 (GAUZE/BANDAGES/DRESSINGS) ×1 IMPLANT
DRAPE LAPAROTOMY 100X72 PEDS (DRAPES) ×2 IMPLANT
DRAPE MICROSCOPE ZEISS OPMI (DRAPES) ×2 IMPLANT
DRAPE POUCH INSTRU U-SHP 10X18 (DRAPES) ×2 IMPLANT
ELECT REM PT RETURN 9FT ADLT (ELECTROSURGICAL) ×2
ELECTRODE REM PT RTRN 9FT ADLT (ELECTROSURGICAL) ×1 IMPLANT
GAUZE SPONGE 4X4 16PLY XRAY LF (GAUZE/BANDAGES/DRESSINGS) IMPLANT
GLOVE BIOGEL PI IND STRL 7.0 (GLOVE) ×1 IMPLANT
GLOVE BIOGEL PI IND STRL 8.5 (GLOVE) ×1 IMPLANT
GLOVE BIOGEL PI INDICATOR 7.0 (GLOVE) ×1
GLOVE BIOGEL PI INDICATOR 8.5 (GLOVE) ×1
GLOVE ECLIPSE 8.5 STRL (GLOVE) ×4 IMPLANT
GLOVE EXAM NITRILE LRG STRL (GLOVE) IMPLANT
GLOVE EXAM NITRILE MD LF STRL (GLOVE) IMPLANT
GLOVE EXAM NITRILE XL STR (GLOVE) IMPLANT
GLOVE EXAM NITRILE XS STR PU (GLOVE) IMPLANT
GLOVE SS BIOGEL STRL SZ 6.5 (GLOVE) ×1 IMPLANT
GLOVE SUPERSENSE BIOGEL SZ 6.5 (GLOVE) ×1
GLOVE SURG SS PI 6.5 STRL IVOR (GLOVE) ×4 IMPLANT
GOWN BRE IMP SLV AUR LG STRL (GOWN DISPOSABLE) ×2 IMPLANT
GOWN BRE IMP SLV AUR XL STRL (GOWN DISPOSABLE) ×2 IMPLANT
GOWN STRL REIN 2XL LVL4 (GOWN DISPOSABLE) ×2 IMPLANT
KIT BASIN OR (CUSTOM PROCEDURE TRAY) ×2 IMPLANT
KIT ROOM TURNOVER OR (KITS) ×2 IMPLANT
NEEDLE HYPO 22GX1.5 SAFETY (NEEDLE) ×2 IMPLANT
NEEDLE SPNL 22GX3.5 QUINCKE BK (NEEDLE) ×2 IMPLANT
NS IRRIG 1000ML POUR BTL (IV SOLUTION) ×2 IMPLANT
PACK LAMINECTOMY NEURO (CUSTOM PROCEDURE TRAY) ×2 IMPLANT
PAD ARMBOARD 7.5X6 YLW CONV (MISCELLANEOUS) ×6 IMPLANT
PIN MAYFIELD SKULL DISP (PIN) ×2 IMPLANT
RUBBERBAND STERILE (MISCELLANEOUS) ×4 IMPLANT
SPONGE GAUZE 4X4 12PLY (GAUZE/BANDAGES/DRESSINGS) ×2 IMPLANT
SPONGE LAP 4X18 X RAY DECT (DISPOSABLE) IMPLANT
SPONGE SURGIFOAM ABS GEL SZ50 (HEMOSTASIS) ×2 IMPLANT
STRIP CLOSURE SKIN 1/2X4 (GAUZE/BANDAGES/DRESSINGS) ×2 IMPLANT
SUT VIC AB 0 CT1 18XCR BRD8 (SUTURE) ×1 IMPLANT
SUT VIC AB 0 CT1 8-18 (SUTURE) ×1
SUT VIC AB 2-0 CT2 18 VCP726D (SUTURE) ×2 IMPLANT
SUT VIC AB 3-0 SH 8-18 (SUTURE) ×2 IMPLANT
SYR 20ML ECCENTRIC (SYRINGE) ×2 IMPLANT
TAPE CLOTH SURG 4X10 WHT LF (GAUZE/BANDAGES/DRESSINGS) ×2 IMPLANT
TOWEL OR 17X24 6PK STRL BLUE (TOWEL DISPOSABLE) ×2 IMPLANT
TOWEL OR 17X26 10 PK STRL BLUE (TOWEL DISPOSABLE) ×2 IMPLANT
WATER STERILE IRR 1000ML POUR (IV SOLUTION) ×2 IMPLANT

## 2012-04-18 NOTE — Anesthesia Postprocedure Evaluation (Signed)
  Anesthesia Post-op Note  Patient: Arthur Wilcox  Procedure(s) Performed: Procedure(s) (LRB): POSTERIOR CERVICAL LAMINECTOMY (Right)  Patient Location: PACU  Anesthesia Type: General  Level of Consciousness: awake, alert  and oriented  Airway and Oxygen Therapy: Patient Spontanous Breathing and Patient connected to nasal cannula oxygen  Post-op Pain: mild  Post-op Assessment: Post-op Vital signs reviewed, Patient's Cardiovascular Status Stable, Respiratory Function Stable, Patent Airway, No signs of Nausea or vomiting and Pain level controlled  Post-op Vital Signs: Reviewed and stable  Complications: No apparent anesthesia complications

## 2012-04-18 NOTE — Progress Notes (Signed)
Dr. Jean Rosenthal notified that patient had hot chocolate with small marshmellows at 0430.

## 2012-04-18 NOTE — Op Note (Signed)
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  Postoperative diagnosis: Same  Procedure Name: Right C7/T1 laminotomy foraminotomy and microdiscectomy.  Surgeon:Sianni Cloninger A.Esteven Overfelt, M.D.  Asst. Surgeon: Danielle Dess  Anesthesia: General  Indication: 61 year old male with history of right upper trimming pain paresthesias and weakness consistent with a right-sided C8 recovery workup demonstrates evidence of a focal right-sided C7-T1 disc herniation with compression the exiting C8 nerve root. Patient presents now for laminotomy and foraminotomies and microdiscectomy in hopes of improving his symptoms.  Operative note: After induction anesthesia, patient positioned prone bolsters with his head fixed in a neutral head position in a Mayfield pin headrest. Patient's posterior cervical region was prepped and draped sterilely. Incision was made overlying the C7-T1 interspace. Supper off Henderson Newcomer performed the right side was the lamina and facet joints. Deep self-retaining her placed intraoperative x-rays taken level was confirmed. Laminotomy foraminotomy were then performed using high-speed drill and Kerrison rongeurs to remove the inferior aspect of the lamina of C7, the medial aspect of the C7-T1 facet joint, and the superior aspect of the T1 lamina. Ligamentum flavum was elevated and resected in piecemeal fashion using Kerrison rongeurs. Underlying thecal sac and exiting right-sided C8 nerve root were notified. Casimiro Needle short brought field these were my dissection of the right-sided C8 nerve root and underlying disc herniation. Epidural venous plexus was coagulated and cut. The C8 nerve root was mobilized gently cephalad. I've to moderately large fragments of free disc herniation were dissected free and removed using pituitary micro-rongeurs. At this point there is no evidence of residual compression upon  the C8 nerve root. There is no is any residual disc herniation at this level. Wound was irrigated out like solution. Gelfoam was placed topically for hemostasis which Ascent be good. Microscope necrosis and were removed. Hemostasis muscle she will try repaired was and close in layers with Vicryl sutures. Steri-Strips and sterile dressing were applied. There were no apparent complications. Patient tolerated the procedure well. He returns to the recovery room postop.

## 2012-04-18 NOTE — Preoperative (Signed)
Beta Blockers   Reason not to administer Beta Blockers:Not Applicable 

## 2012-04-18 NOTE — Anesthesia Preprocedure Evaluation (Signed)
Anesthesia Evaluation  Patient identified by MRN, date of birth, ID band Patient awake    Reviewed: Allergy & Precautions, H&P , NPO status , Patient's Chart, lab work & pertinent test results  History of Anesthesia Complications Negative for: history of anesthetic complications  Airway Mallampati: I TM Distance: >3 FB Neck ROM: Full    Dental  (+) Edentulous Upper and Edentulous Lower   Pulmonary Current Smoker,  breath sounds clear to auscultation  Pulmonary exam normal       Cardiovascular hypertension, Pt. on medications Rhythm:Regular Rate:Normal  3/13 Stress: probably negative, EF 43% 3/13 ECHO: normal LVF, EF 50-55%, VALVES ok   Neuro/Psych Seizures - (Last seizure 4-5 years aog, off meds now), Well Controlled,  PSYCHIATRIC DISORDERS Anxiety    GI/Hepatic negative GI ROS, Neg liver ROS,   Endo/Other  negative endocrine ROS  Renal/GU negative Renal ROS     Musculoskeletal  (+) Arthritis -,   Abdominal   Peds  Hematology   Anesthesia Other Findings   Reproductive/Obstetrics                           Anesthesia Physical Anesthesia Plan  ASA: II  Anesthesia Plan: General   Post-op Pain Management:    Induction: Intravenous  Airway Management Planned: Oral ETT  Additional Equipment:   Intra-op Plan:   Post-operative Plan: Extubation in OR  Informed Consent: I have reviewed the patients History and Physical, chart, labs and discussed the procedure including the risks, benefits and alternatives for the proposed anesthesia with the patient or authorized representative who has indicated his/her understanding and acceptance.     Plan Discussed with: Surgeon and CRNA  Anesthesia Plan Comments: (Plan routine monitors, GETA)        Anesthesia Quick Evaluation

## 2012-04-18 NOTE — Transfer of Care (Signed)
Immediate Anesthesia Transfer of Care Note  Patient: Arthur Wilcox  Procedure(s) Performed: Procedure(s) (LRB): POSTERIOR CERVICAL LAMINECTOMY (Right)  Patient Location: PACU  Anesthesia Type: General  Level of Consciousness: awake, alert  and oriented  Airway & Oxygen Therapy: Patient Spontanous Breathing and Patient connected to nasal cannula oxygen  Post-op Assessment: Report given to PACU RN, Post -op Vital signs reviewed and stable and Patient moving all extremities  Post vital signs: Reviewed and stable  Complications: No apparent anesthesia complications

## 2012-04-18 NOTE — Brief Op Note (Signed)
04/18/2012  2:03 PM  PATIENT:  Arthur Wilcox  61 y.o. male  PRE-OPERATIVE DIAGNOSIS:  HNP w/radiculopathy  POST-OPERATIVE DIAGNOSIS:  * No post-op diagnosis entered *  PROCEDURE:  Procedure(s) (LRB): POSTERIOR CERVICAL LAMINECTOMY (Right)  SURGEON:  Surgeon(s) and Role:    * Temple Pacini, MD - Primary  PHYSICIAN ASSISTANT:   ASSISTANTS: Elsner   ANESTHESIA:   general  EBL:  Total I/O In: -  Out: 75 [Blood:75]  BLOOD ADMINISTERED:none  DRAINS: none   LOCAL MEDICATIONS USED:  MARCAINE     SPECIMEN:  No Specimen  DISPOSITION OF SPECIMEN:  N/A  COUNTS:  YES  TOURNIQUET:  * No tourniquets in log *  DICTATION: .Dragon Dictation  PLAN OF CARE: Admit for overnight observation  PATIENT DISPOSITION:  PACU - hemodynamically stable.   Delay start of Pharmacological VTE agent (>24hrs) due to surgical blood loss or risk of bleeding: yes

## 2012-04-18 NOTE — H&P (Signed)
Torrell Krutz is an 61 y.o. male.   Chief Complaint: Right upper cavity pain and weakness HPI: 61 year old male with right upper trimming a pain paresthesias and weakness consistent with a C8 radiculopathy failing conservative management her workup demonstrates evidence of a focal right-sided C7-T1 disc herniation with marked compression exiting C8 nerve root. Patient has failed conservative management and presents now for laminotomy foraminotomy and microdiscectomy in hopes of improving his symptoms.  Past Medical History  Diagnosis Date  . Anxiety   . Osteoarthritis   . History of foot fracture   . Chronic back pain   . Essential hypertension, benign     takes Lisinopril daily but forgets on occasion  . Headache   . Hyperlipidemia     hx of;has been off of Lipitor 6-48yrs   . Shortness of breath     with exertion  . Migraine     last one a week ago  . Seizures     has been off of Dilantin 26yr;last seizure 21yrs ago  . Joint disease   . Chronic back pain     pinced nerve  . Chronic neck pain     pinched nerve  . Depression     doesn't require meds    History reviewed. No pertinent past surgical history.  Family History  Problem Relation Age of Onset  . Hypertension Mother   . Stroke Mother   . Diabetes type II Brother   . Coronary artery disease Brother   . Diabetes type II Sister   . Anesthesia problems Neg Hx   . Hypotension Neg Hx   . Malignant hyperthermia Neg Hx   . Pseudochol deficiency Neg Hx    Social History:  reports that he has been smoking Cigarettes and Cigars.  He has smoked for the past 44 years. He has never used smokeless tobacco. He reports that he drinks about .6 ounces of alcohol per week. He reports that he uses illicit drugs (Marijuana).  Allergies: No Known Allergies  Medications Prior to Admission  Medication Sig Dispense Refill  . lisinopril (PRINIVIL,ZESTRIL) 10 MG tablet Take 10 mg by mouth daily.        No results found for this or any  previous visit (from the past 48 hour(s)). No results found.  Review of Systems  Constitutional: Negative.   HENT: Negative.   Eyes: Negative.   Respiratory: Negative.   Cardiovascular: Negative.   Gastrointestinal: Negative.   Genitourinary: Negative.   Musculoskeletal: Negative.   Skin: Negative.   Neurological: Negative.   Endo/Heme/Allergies: Negative.   Psychiatric/Behavioral: Negative.     Blood pressure 126/75, pulse 62, temperature 99 F (37.2 C), temperature source Oral, resp. rate 18, SpO2 99.00%. Physical Exam  Constitutional: He is oriented to person, place, and time. He appears well-developed and well-nourished.  HENT:  Head: Normocephalic and atraumatic.  Right Ear: External ear normal.  Left Ear: External ear normal.  Nose: Nose normal.  Mouth/Throat: Oropharynx is clear and moist.  Eyes: Conjunctivae and EOM are normal. Pupils are equal, round, and reactive to light.  Neck: Normal range of motion. Neck supple. No tracheal deviation present. No thyromegaly present.  Cardiovascular: Normal rate, regular rhythm, normal heart sounds and intact distal pulses.   Respiratory: Effort normal and breath sounds normal. No respiratory distress. He has no wheezes.  GI: Soft. Bowel sounds are normal. He exhibits no distension. There is no tenderness.  Musculoskeletal: Normal range of motion. He exhibits no edema and no tenderness.  Neurological: He is alert and oriented to person, place, and time. He has normal reflexes. He displays normal reflexes. No cranial nerve deficit. He exhibits normal muscle tone. Coordination normal.  Skin: Skin is warm and dry. No rash noted. No erythema. No pallor.  Psychiatric: He has a normal mood and affect. His behavior is normal. Judgment and thought content normal.     Assessment/Plan Right C7-T1 herniated pulposus with radiculopathy. Plan right C7-T1 laminotomy foraminotomy and microdiscectomy. Risks and benefits have been explained.  Patient wishes to proceed.  Epsie Walthall A 04/18/2012, 12:11 PM

## 2012-04-19 ENCOUNTER — Encounter (HOSPITAL_COMMUNITY): Payer: Self-pay | Admitting: Neurosurgery

## 2012-04-19 MED ORDER — CYCLOBENZAPRINE HCL 10 MG PO TABS: 10.0000 mg | ORAL_TABLET | Freq: Three times a day (TID) | ORAL | Status: AC | PRN

## 2012-04-19 MED ORDER — HYDROCODONE-ACETAMINOPHEN 5-325 MG PO TABS: 1.0000 | ORAL_TABLET | ORAL | Status: AC | PRN

## 2012-04-19 NOTE — Discharge Instructions (Signed)
Wound Care Keep incision covered and dry for one week.  If you shower prior to then, cover incision with plastic wrap.  You may remove outer bandage after one week and shower.  Do not put any creams, lotions, or ointments on incision. Leave steri-strips on neck.  They will fall off by themselves. Activity Walk each and every day, increasing distance each day. No lifting greater than 5 lbs.  Avoid excessive neck motion. No driving for 2 weeks; may ride as a passenger locally.  Diet Resume your normal diet.  Return to Work Will be discussed at you follow up appointment. Call Your Doctor If Any of These Occur Redness, drainage, or swelling at the wound.  Temperature greater than 101 degrees. Severe pain not relieved by pain medication. Increased difficulty swallowing.  Incision starts to come apart. Follow Up Appt Call today for appointment in 1-2 weeks ([phone]) or for problems.  If you have any hardware placed in your spine, you will need an x-ray before your appointment.  

## 2012-04-19 NOTE — Discharge Summary (Signed)
Physician Discharge Summary  Patient ID: Johnjoseph Rolfe MRN: [id number] DOB/AGE: 59-Nov-1952 59 y.o.  Admit date: 04/18/2012 Discharge date: 04/19/2012  Admission Diagnoses:  Discharge Diagnoses:  Principal Problem:  *Herniation of cervical intervertebral disc with radiculopathy   Discharged Condition: good  Hospital Course: Admitted to hospital where he underwent an uncomplicated cervical lami.  Post op doing well. No pain. Exam improved. Plan dc home  Consults:   Significant Diagnostic Studies:   Treatments:   Discharge Exam: Blood pressure 113/66, pulse 65, temperature 98 F (36.7 C), temperature source Oral, resp. rate 14, SpO2 97.00%. A/A OX4 Motor intact. Slight right c8 sens loss. OW intact. Wd C/D/I  Disposition: Final discharge disposition not confirmed   Medication List  As of 04/19/2012  8:12 AM   TAKE these medications         cyclobenzaprine 10 MG tablet   Commonly known as: FLEXERIL   Take 1 tablet (10 mg total) by mouth 3 (three) times daily as needed for muscle spasms.      HYDROcodone-acetaminophen 5-325 MG per tablet   Commonly known as: NORCO   Take 1-2 tablets by mouth every 4 (four) hours as needed.      lisinopril 10 MG tablet   Commonly known as: PRINIVIL,ZESTRIL   Take 10 mg by mouth daily.           Follow-up Information    Follow up with Rickayla Wieland A, MD. Call in 1 week. (ask for cystal)    Contact information:   [address] [place] [address] [phone]          Signed: Temple Pacini 04/19/2012, 8:12 AM

## 2012-05-24 ENCOUNTER — Other Ambulatory Visit: Payer: Self-pay | Admitting: Neurosurgery

## 2012-05-26 ENCOUNTER — Encounter (HOSPITAL_COMMUNITY): Payer: Self-pay | Admitting: Pharmacy Technician

## 2012-05-26 ENCOUNTER — Encounter (HOSPITAL_COMMUNITY): Payer: Self-pay

## 2012-05-26 ENCOUNTER — Encounter (HOSPITAL_COMMUNITY)
Admission: RE | Admit: 2012-05-26 | Discharge: 2012-05-26 | Disposition: A | Discharge status: Home / Self Care | Payer: Medicaid Other | Source: Ambulatory Visit | Attending: Neurosurgery | Admitting: Neurosurgery

## 2012-05-26 LAB — SURGICAL PCR SCREEN
MRSA, PCR: NEGATIVE
Staphylococcus aureus: NEGATIVE

## 2012-05-26 LAB — BASIC METABOLIC PANEL
BUN: 9 mg/dL (ref 6–23)
Chloride: 100 mEq/L (ref 96–112)
Creatinine, Ser: 1.11 mg/dL (ref 0.50–1.35)
GFR calc Af Amer: 83 mL/min — ABNORMAL LOW (ref >90)
Glucose, Bld: 85 mg/dL (ref 70–99)
Potassium: 4.1 mEq/L (ref 3.5–5.1)

## 2012-05-26 LAB — CBC
HCT: 42.7 % (ref 39.0–52.0)
Hemoglobin: 14.4 g/dL (ref 13.0–17.0)
MCH: 30.8 pg (ref 26.0–34.0)
MCHC: 33.7 g/dL (ref 30.0–36.0)
RDW: 13.4 % (ref 11.5–15.5)

## 2012-05-26 LAB — DIFFERENTIAL
Basophils Absolute: 0 10*3/uL (ref 0.0–0.1)
Basophils Relative: 0 % (ref 0–1)
Eosinophils Absolute: 0.1 10*3/uL (ref 0.0–0.7)
Eosinophils Relative: 1 % (ref 0–5)
Monocytes Absolute: 0.5 10*3/uL (ref 0.1–1.0)
Monocytes Relative: 7 % (ref 3–12)
Neutro Abs: 4.4 10*3/uL (ref 1.7–7.7)

## 2012-05-26 NOTE — Pre-Procedure Instructions (Signed)
[address] Cauy Melody  05/26/2012   Your procedure is scheduled on:  Monday June 24  Report to [facility] at 5:30 AM.  Call this number if you have problems the morning of surgery: [phone]   Remember:   Do not eat food:After Midnight.  May have clear liquids:until Midnight .  Clear liquids include soda, tea, black coffee, apple or grape juice, broth.  Take these medicines the morning of surgery with A SIP OF WATER: Norco (pain pill) and Flexeril if needed   Do not wear jewelry, make-up or nail polish.  Do not wear lotions, powders, or perfumes. You may wear deodorant.  Do not shave 48 hours prior to surgery. Men may shave face and neck.  Do not bring valuables to the hospital.  Contacts, dentures or bridgework may not be worn into surgery.  Leave suitcase in the car. After surgery it may be brought to your room.  For patients admitted to the hospital, checkout time is 11:00 AM the day of discharge.   Patients discharged the day of surgery will not be allowed to drive home.  Name and phone number of your driver: NA  Special Instructions: CHG Shower Use Special Wash: 1/2 bottle night before surgery and 1/2 bottle morning of surgery.   Please read over the following fact sheets that you were given: Pain Booklet, Coughing and Deep Breathing, Blood Transfusion Information and Surgical Site Infection Prevention

## 2012-05-28 MED ORDER — DEXAMETHASONE SODIUM PHOSPHATE 10 MG/ML IJ SOLN
10.0000 mg | INTRAMUSCULAR | Status: AC
  Administered 2012-05-29: 10 mg via INTRAVENOUS
  Filled 2012-05-28: qty 1

## 2012-05-28 MED ORDER — CEFAZOLIN SODIUM 1-5 GM-% IV SOLN
1.0000 g | INTRAVENOUS | Status: AC
  Administered 2012-05-29: 1 g via INTRAVENOUS
  Filled 2012-05-28: qty 50

## 2012-05-29 ENCOUNTER — Ambulatory Visit (HOSPITAL_COMMUNITY): Payer: Medicaid Other | Admitting: Anesthesiology

## 2012-05-29 ENCOUNTER — Ambulatory Visit (HOSPITAL_COMMUNITY)
Admission: RE | Admit: 2012-05-29 | Discharge: 2012-05-30 | Disposition: A | Discharge status: Home / Self Care | Payer: Medicaid Other | Source: Ambulatory Visit | Attending: Neurosurgery | Admitting: Neurosurgery

## 2012-05-29 ENCOUNTER — Ambulatory Visit (HOSPITAL_COMMUNITY): Payer: Medicaid Other

## 2012-05-29 ENCOUNTER — Encounter (HOSPITAL_COMMUNITY)
Admission: RE | Disposition: A | Discharge status: Home / Self Care | Payer: Self-pay | Source: Ambulatory Visit | Attending: Neurosurgery

## 2012-05-29 ENCOUNTER — Encounter (HOSPITAL_COMMUNITY): Payer: Self-pay | Admitting: Anesthesiology

## 2012-05-29 ENCOUNTER — Encounter (HOSPITAL_COMMUNITY): Payer: Self-pay | Admitting: *Deleted

## 2012-05-29 DIAGNOSIS — G8929 Other chronic pain: Secondary | ICD-10-CM | POA: Insufficient documentation

## 2012-05-29 DIAGNOSIS — Z01812 Encounter for preprocedural laboratory examination: Secondary | ICD-10-CM | POA: Insufficient documentation

## 2012-05-29 DIAGNOSIS — M48062 Spinal stenosis, lumbar region with neurogenic claudication: Secondary | ICD-10-CM | POA: Diagnosis present

## 2012-05-29 DIAGNOSIS — I10 Essential (primary) hypertension: Secondary | ICD-10-CM | POA: Insufficient documentation

## 2012-05-29 DIAGNOSIS — F3289 Other specified depressive episodes: Secondary | ICD-10-CM | POA: Insufficient documentation

## 2012-05-29 DIAGNOSIS — F411 Generalized anxiety disorder: Secondary | ICD-10-CM | POA: Insufficient documentation

## 2012-05-29 DIAGNOSIS — M1990 Unspecified osteoarthritis, unspecified site: Secondary | ICD-10-CM | POA: Insufficient documentation

## 2012-05-29 DIAGNOSIS — G43909 Migraine, unspecified, not intractable, without status migrainosus: Secondary | ICD-10-CM | POA: Insufficient documentation

## 2012-05-29 DIAGNOSIS — F329 Major depressive disorder, single episode, unspecified: Secondary | ICD-10-CM | POA: Insufficient documentation

## 2012-05-29 HISTORY — PX: LUMBAR LAMINECTOMY/DECOMPRESSION MICRODISCECTOMY: SHX5026

## 2012-05-29 SURGERY — LUMBAR LAMINECTOMY/DECOMPRESSION MICRODISCECTOMY 2 LEVELS
Anesthesia: General | Site: Back | Laterality: Bilateral | Wound class: Clean

## 2012-05-29 MED ORDER — SODIUM CHLORIDE 0.9 % IV SOLN
INTRAVENOUS | Status: AC
  Filled 2012-05-29: qty 500

## 2012-05-29 MED ORDER — FENTANYL CITRATE 0.05 MG/ML IJ SOLN
INTRAMUSCULAR | Status: DC | PRN
  Administered 2012-05-29 (×2): 50 ug via INTRAVENOUS
  Administered 2012-05-29: 150 ug via INTRAVENOUS

## 2012-05-29 MED ORDER — PROPOFOL 10 MG/ML IV EMUL
INTRAVENOUS | Status: DC | PRN
  Administered 2012-05-29: 160 mg via INTRAVENOUS

## 2012-05-29 MED ORDER — PHENYLEPHRINE HCL 10 MG/ML IJ SOLN
INTRAMUSCULAR | Status: DC | PRN
  Administered 2012-05-29: 40 ug via INTRAVENOUS

## 2012-05-29 MED ORDER — SENNA 8.6 MG PO TABS
1.0000 | ORAL_TABLET | Freq: Two times a day (BID) | ORAL | Status: DC
  Administered 2012-05-29 – 2012-05-30 (×3): 8.6 mg via ORAL
  Filled 2012-05-29 (×4): qty 1

## 2012-05-29 MED ORDER — CEFAZOLIN SODIUM 1-5 GM-% IV SOLN
1.0000 g | Freq: Three times a day (TID) | INTRAVENOUS | Status: AC
  Administered 2012-05-29 (×2): 1 g via INTRAVENOUS
  Filled 2012-05-29 (×2): qty 50

## 2012-05-29 MED ORDER — ACETAMINOPHEN 325 MG PO TABS: 650.0000 mg | ORAL_TABLET | ORAL | Status: DC | PRN

## 2012-05-29 MED ORDER — 0.9 % SODIUM CHLORIDE (POUR BTL) OPTIME
TOPICAL | Status: DC | PRN
  Administered 2012-05-29: 1000 mL

## 2012-05-29 MED ORDER — POLYETHYLENE GLYCOL 3350 17 G PO PACK
17.0000 g | PACK | Freq: Every day | ORAL | Status: DC | PRN
  Filled 2012-05-29: qty 1

## 2012-05-29 MED ORDER — SODIUM CHLORIDE 0.9 % IJ SOLN: 3.0000 mL | INTRAMUSCULAR | Status: DC | PRN

## 2012-05-29 MED ORDER — HYDROCODONE-ACETAMINOPHEN 5-325 MG PO TABS: 1.0000 | ORAL_TABLET | ORAL | Status: DC | PRN

## 2012-05-29 MED ORDER — THROMBIN 5000 UNITS EX KIT
PACK | CUTANEOUS | Status: DC | PRN
  Administered 2012-05-29 (×2): 5000 [IU] via TOPICAL

## 2012-05-29 MED ORDER — LACTATED RINGERS IV SOLN
INTRAVENOUS | Status: DC | PRN
  Administered 2012-05-29 (×2): via INTRAVENOUS

## 2012-05-29 MED ORDER — ACETAMINOPHEN 650 MG RE SUPP: 650.0000 mg | RECTAL | Status: DC | PRN

## 2012-05-29 MED ORDER — EPHEDRINE SULFATE 50 MG/ML IJ SOLN
INTRAMUSCULAR | Status: DC | PRN
  Administered 2012-05-29 (×2): 10 mg via INTRAVENOUS

## 2012-05-29 MED ORDER — SODIUM CHLORIDE 0.9 % IR SOLN
Status: DC | PRN
  Administered 2012-05-29: 08:00:00

## 2012-05-29 MED ORDER — OXYCODONE-ACETAMINOPHEN 5-325 MG PO TABS
1.0000 | ORAL_TABLET | ORAL | Status: DC | PRN
  Administered 2012-05-29 – 2012-05-30 (×3): 2 via ORAL
  Filled 2012-05-29 (×3): qty 2

## 2012-05-29 MED ORDER — HYDROMORPHONE HCL PF 1 MG/ML IJ SOLN
0.2500 mg | INTRAMUSCULAR | Status: DC | PRN
  Administered 2012-05-29 (×2): 0.25 mg via INTRAVENOUS

## 2012-05-29 MED ORDER — KETOROLAC TROMETHAMINE 30 MG/ML IJ SOLN
INTRAMUSCULAR | Status: AC
  Filled 2012-05-29: qty 1

## 2012-05-29 MED ORDER — KETOROLAC TROMETHAMINE 30 MG/ML IJ SOLN
30.0000 mg | Freq: Once | INTRAMUSCULAR | Status: AC
  Administered 2012-05-29: 30 mg via INTRAVENOUS

## 2012-05-29 MED ORDER — SODIUM CHLORIDE 0.9 % IJ SOLN
3.0000 mL | Freq: Two times a day (BID) | INTRAMUSCULAR | Status: DC
  Administered 2012-05-29 (×2): 3 mL via INTRAVENOUS

## 2012-05-29 MED ORDER — ZOLPIDEM TARTRATE 5 MG PO TABS: 10.0000 mg | ORAL_TABLET | Freq: Every evening | ORAL | Status: DC | PRN

## 2012-05-29 MED ORDER — LISINOPRIL 10 MG PO TABS
10.0000 mg | ORAL_TABLET | Freq: Every day | ORAL | Status: DC
  Administered 2012-05-30: 10 mg via ORAL
  Filled 2012-05-29 (×2): qty 1

## 2012-05-29 MED ORDER — NEOSTIGMINE METHYLSULFATE 1 MG/ML IJ SOLN
INTRAMUSCULAR | Status: DC | PRN
  Administered 2012-05-29: 3 mg via INTRAVENOUS

## 2012-05-29 MED ORDER — CYCLOBENZAPRINE HCL 10 MG PO TABS
10.0000 mg | ORAL_TABLET | Freq: Three times a day (TID) | ORAL | Status: DC | PRN
  Administered 2012-05-29: 10 mg via ORAL
  Filled 2012-05-29: qty 1

## 2012-05-29 MED ORDER — ALUM & MAG HYDROXIDE-SIMETH 200-200-20 MG/5ML PO SUSP: 30.0000 mL | Freq: Four times a day (QID) | ORAL | Status: DC | PRN

## 2012-05-29 MED ORDER — KETOROLAC TROMETHAMINE 30 MG/ML IJ SOLN
30.0000 mg | Freq: Four times a day (QID) | INTRAMUSCULAR | Status: DC
  Administered 2012-05-29 – 2012-05-30 (×3): 30 mg via INTRAVENOUS
  Filled 2012-05-29 (×7): qty 1

## 2012-05-29 MED ORDER — MIDAZOLAM HCL 5 MG/5ML IJ SOLN
INTRAMUSCULAR | Status: DC | PRN
  Administered 2012-05-29: 2 mg via INTRAVENOUS

## 2012-05-29 MED ORDER — ONDANSETRON HCL 4 MG/2ML IJ SOLN: 4.0000 mg | INTRAMUSCULAR | Status: DC | PRN

## 2012-05-29 MED ORDER — LIDOCAINE HCL (CARDIAC) 20 MG/ML IV SOLN
INTRAVENOUS | Status: DC | PRN
  Administered 2012-05-29: 50 mg via INTRAVENOUS

## 2012-05-29 MED ORDER — SODIUM CHLORIDE 0.9 % IV SOLN
250.0000 mL | INTRAVENOUS | Status: DC
  Administered 2012-05-29: 250 mL via INTRAVENOUS

## 2012-05-29 MED ORDER — BUPIVACAINE HCL (PF) 0.25 % IJ SOLN
INTRAMUSCULAR | Status: DC | PRN
  Administered 2012-05-29: 20 mL

## 2012-05-29 MED ORDER — FLEET ENEMA 7-19 GM/118ML RE ENEM
1.0000 | ENEMA | Freq: Once | RECTAL | Status: AC | PRN
  Filled 2012-05-29: qty 1

## 2012-05-29 MED ORDER — HYDROMORPHONE HCL PF 1 MG/ML IJ SOLN
0.5000 mg | INTRAMUSCULAR | Status: DC | PRN
  Administered 2012-05-30: 1 mg via INTRAVENOUS
  Filled 2012-05-29: qty 1

## 2012-05-29 MED ORDER — GLYCOPYRROLATE 0.2 MG/ML IJ SOLN
INTRAMUSCULAR | Status: DC | PRN
  Administered 2012-05-29: 0.6 mg via INTRAVENOUS

## 2012-05-29 MED ORDER — PHENOL 1.4 % MT LIQD: 1.0000 | OROMUCOSAL | Status: DC | PRN

## 2012-05-29 MED ORDER — MENTHOL 3 MG MT LOZG: 1.0000 | LOZENGE | OROMUCOSAL | Status: DC | PRN

## 2012-05-29 MED ORDER — HEMOSTATIC AGENTS (NO CHARGE) OPTIME
TOPICAL | Status: DC | PRN
  Administered 2012-05-29: 1 via TOPICAL

## 2012-05-29 MED ORDER — BACITRACIN 50000 UNITS IM SOLR
INTRAMUSCULAR | Status: AC
  Filled 2012-05-29: qty 1

## 2012-05-29 MED ORDER — ROCURONIUM BROMIDE 100 MG/10ML IV SOLN
INTRAVENOUS | Status: DC | PRN
  Administered 2012-05-29: 50 mg via INTRAVENOUS
  Administered 2012-05-29: 5 mg via INTRAVENOUS

## 2012-05-29 MED ORDER — BISACODYL 10 MG RE SUPP: 10.0000 mg | Freq: Every day | RECTAL | Status: DC | PRN

## 2012-05-29 MED ORDER — HYDROMORPHONE HCL PF 1 MG/ML IJ SOLN
INTRAMUSCULAR | Status: AC
  Filled 2012-05-29: qty 1

## 2012-05-29 SURGICAL SUPPLY — 55 items
BAG DECANTER FOR FLEXI CONT (MISCELLANEOUS) ×2 IMPLANT
BENZOIN TINCTURE PRP APPL 2/3 (GAUZE/BANDAGES/DRESSINGS) ×2 IMPLANT
BLADE SURG ROTATE 9660 (MISCELLANEOUS) IMPLANT
BRUSH SCRUB EZ PLAIN DRY (MISCELLANEOUS) ×2 IMPLANT
BUR CUTTER 7.0 ROUND (BURR) ×2 IMPLANT
CANISTER SUCTION 2500CC (MISCELLANEOUS) ×2 IMPLANT
CLOTH BEACON ORANGE TIMEOUT ST (SAFETY) ×2 IMPLANT
CONT SPEC 4OZ CLIKSEAL STRL BL (MISCELLANEOUS) ×2 IMPLANT
DECANTER SPIKE VIAL GLASS SM (MISCELLANEOUS) IMPLANT
DERMABOND ADHESIVE PROPEN (GAUZE/BANDAGES/DRESSINGS) ×1
DERMABOND ADVANCED (GAUZE/BANDAGES/DRESSINGS) ×1
DERMABOND ADVANCED .7 DNX12 (GAUZE/BANDAGES/DRESSINGS) ×1 IMPLANT
DERMABOND ADVANCED .7 DNX6 (GAUZE/BANDAGES/DRESSINGS) ×1 IMPLANT
DRAPE LAPAROTOMY 100X72X124 (DRAPES) ×2 IMPLANT
DRAPE MICROSCOPE LEICA (MISCELLANEOUS) ×2 IMPLANT
DRAPE MICROSCOPE ZEISS OPMI (DRAPES) IMPLANT
DRAPE POUCH INSTRU U-SHP 10X18 (DRAPES) ×2 IMPLANT
DRAPE PROXIMA HALF (DRAPES) IMPLANT
DRAPE SURG 17X23 STRL (DRAPES) ×4 IMPLANT
DURASEAL SPINE SEALANT 3ML (MISCELLANEOUS) ×2 IMPLANT
ELECT REM PT RETURN 9FT ADLT (ELECTROSURGICAL) ×2
ELECTRODE REM PT RTRN 9FT ADLT (ELECTROSURGICAL) ×1 IMPLANT
GAUZE SPONGE 4X4 16PLY XRAY LF (GAUZE/BANDAGES/DRESSINGS) IMPLANT
GLOVE BIOGEL PI IND STRL 7.0 (GLOVE) ×1 IMPLANT
GLOVE BIOGEL PI INDICATOR 7.0 (GLOVE) ×1
GLOVE ECLIPSE 7.5 STRL STRAW (GLOVE) ×2 IMPLANT
GLOVE ECLIPSE 8.5 STRL (GLOVE) ×2 IMPLANT
GLOVE EXAM NITRILE LRG STRL (GLOVE) IMPLANT
GLOVE EXAM NITRILE MD LF STRL (GLOVE) ×2 IMPLANT
GLOVE EXAM NITRILE XL STR (GLOVE) IMPLANT
GLOVE EXAM NITRILE XS STR PU (GLOVE) IMPLANT
GLOVE SURG SS PI 6.5 STRL IVOR (GLOVE) ×4 IMPLANT
GOWN BRE IMP SLV AUR LG STRL (GOWN DISPOSABLE) ×4 IMPLANT
GOWN BRE IMP SLV AUR XL STRL (GOWN DISPOSABLE) ×2 IMPLANT
GOWN STRL REIN 2XL LVL4 (GOWN DISPOSABLE) IMPLANT
KIT BASIN OR (CUSTOM PROCEDURE TRAY) ×2 IMPLANT
KIT ROOM TURNOVER OR (KITS) ×2 IMPLANT
NEEDLE HYPO 22GX1.5 SAFETY (NEEDLE) ×2 IMPLANT
NEEDLE SPNL 22GX3.5 QUINCKE BK (NEEDLE) ×2 IMPLANT
NS IRRIG 1000ML POUR BTL (IV SOLUTION) ×2 IMPLANT
PACK LAMINECTOMY NEURO (CUSTOM PROCEDURE TRAY) ×2 IMPLANT
PAD ARMBOARD 7.5X6 YLW CONV (MISCELLANEOUS) ×6 IMPLANT
RUBBERBAND STERILE (MISCELLANEOUS) ×4 IMPLANT
SPONGE GAUZE 4X4 12PLY (GAUZE/BANDAGES/DRESSINGS) ×2 IMPLANT
SPONGE SURGIFOAM ABS GEL SZ50 (HEMOSTASIS) ×2 IMPLANT
STRIP CLOSURE SKIN 1/2X4 (GAUZE/BANDAGES/DRESSINGS) ×2 IMPLANT
STRIP CLOSURE SKIN 1/4X4 (GAUZE/BANDAGES/DRESSINGS) ×2 IMPLANT
SUT PROLENE 6 0 BV (SUTURE) ×2 IMPLANT
SUT VIC AB 2-0 CT1 18 (SUTURE) ×2 IMPLANT
SUT VIC AB 3-0 SH 8-18 (SUTURE) ×2 IMPLANT
SYR 20ML ECCENTRIC (SYRINGE) ×2 IMPLANT
TAPE CLOTH SURG 4X10 WHT LF (GAUZE/BANDAGES/DRESSINGS) ×2 IMPLANT
TOWEL OR 17X24 6PK STRL BLUE (TOWEL DISPOSABLE) ×2 IMPLANT
TOWEL OR 17X26 10 PK STRL BLUE (TOWEL DISPOSABLE) ×2 IMPLANT
WATER STERILE IRR 1000ML POUR (IV SOLUTION) ×2 IMPLANT

## 2012-05-29 NOTE — Anesthesia Procedure Notes (Signed)
Procedure Name: Intubation Date/Time: 05/29/2012 7:49 AM Performed by: Gwenyth Allegra Pre-anesthesia Checklist: Patient being monitored, Suction available, Emergency Drugs available, Patient identified and Timeout performed Patient Re-evaluated:Patient Re-evaluated prior to inductionOxygen Delivery Method: Circle system utilized Preoxygenation: Pre-oxygenation with 100% oxygen Intubation Type: IV induction Ventilation: Mask ventilation without difficulty and Oral airway inserted - appropriate to patient size Laryngoscope Size: Mac and 3 Grade View: Grade I Tube type: Oral Tube size: 8.0 mm Number of attempts: 1 Airway Equipment and Method: Stylet Placement Confirmation: ETT inserted through vocal cords under direct vision,  breath sounds checked- equal and bilateral and positive ETCO2 Secured at: 22 cm Tube secured with: Tape Dental Injury: Teeth and Oropharynx as per pre-operative assessment

## 2012-05-29 NOTE — Transfer of Care (Signed)
Immediate Anesthesia Transfer of Care Note  Patient: Arthur Wilcox  Procedure(s) Performed: Procedure(s) (LRB): LUMBAR LAMINECTOMY/DECOMPRESSION MICRODISCECTOMY 2 LEVELS (Bilateral)  Patient Location: PACU  Anesthesia Type: General  Level of Consciousness: awake  Airway & Oxygen Therapy: Patient Spontanous Breathing and Patient connected to nasal cannula oxygen  Post-op Assessment: Report given to PACU RN  Post vital signs: Reviewed and stable  Complications: No apparent anesthesia complications

## 2012-05-29 NOTE — Preoperative (Signed)
Beta Blockers   Reason not to administer Beta Blockers:Not Applicable 

## 2012-05-29 NOTE — Anesthesia Postprocedure Evaluation (Signed)
  Anesthesia Post-op Note  Patient: Arthur Wilcox  Procedure(s) Performed: Procedure(s) (LRB): LUMBAR LAMINECTOMY/DECOMPRESSION MICRODISCECTOMY 2 LEVELS (Bilateral)  Patient Location: PACU  Anesthesia Type: General  Level of Consciousness: awake  Airway and Oxygen Therapy: Patient Spontanous Breathing  Post-op Pain: mild  Post-op Assessment: Post-op Vital signs reviewed  Post-op Vital Signs: Reviewed  Complications: No apparent anesthesia complications

## 2012-05-29 NOTE — Brief Op Note (Signed)
05/29/2012  9:59 AM  PATIENT:  Arthur Wilcox  61 y.o. male  PRE-OPERATIVE DIAGNOSIS:  stenosis  POST-OPERATIVE DIAGNOSIS:  stenosis   PROCEDURE:  Procedure(s) (LRB): LUMBAR LAMINECTOMY/DECOMPRESSION MICRODISCECTOMY 2 LEVELS (Bilateral)  SURGEON:  Surgeon(s) and Role:    * Temple Pacini, MD - Primary    * Clydene Fake, MD - Assisting  PHYSICIAN ASSISTANT:   ASSISTANTS:    ANESTHESIA:   general  EBL:  Total I/O In: 1000 [I.V.:1000] Out: -   BLOOD ADMINISTERED:none  DRAINS: none   LOCAL MEDICATIONS USED:  MARCAINE     SPECIMEN:  No Specimen  DISPOSITION OF SPECIMEN:  N/A  COUNTS:  YES  TOURNIQUET:  * No tourniquets in log *  DICTATION: .Dragon Dictation  PLAN OF CARE: Admit for overnight observation  PATIENT DISPOSITION:  PACU - hemodynamically stable.   Delay start of Pharmacological VTE agent (>24hrs) due to surgical blood loss or risk of bleeding: yes

## 2012-05-29 NOTE — Plan of Care (Signed)
Problem: Consults Goal: Diagnosis - Spinal Surgery Outcome: Completed/Met Date Met:  05/29/12 Lumbar Laminectomy (Complex)

## 2012-05-29 NOTE — Anesthesia Preprocedure Evaluation (Addendum)
Anesthesia Evaluation  Patient identified by MRN, date of birth, ID band Patient awake    Reviewed: Allergy & Precautions, H&P , NPO status   Airway Mallampati: II      Dental   Pulmonary  breath sounds clear to auscultation        Cardiovascular hypertension, Pt. on medications Rhythm:Regular Rate:Normal     Neuro/Psych    GI/Hepatic negative GI ROS, Neg liver ROS,   Endo/Other  negative endocrine ROS  Renal/GU      Musculoskeletal   Abdominal   Peds  Hematology   Anesthesia Other Findings   Reproductive/Obstetrics                           Anesthesia Physical Anesthesia Plan  ASA: III  Anesthesia Plan: General   Post-op Pain Management:    Induction: Intravenous  Airway Management Planned: Oral ETT  Additional Equipment:   Intra-op Plan:   Post-operative Plan:   Informed Consent:   Plan Discussed with: CRNA  Anesthesia Plan Comments:         Anesthesia Quick Evaluation

## 2012-05-29 NOTE — H&P (Signed)
Arthur Wilcox is an 61 y.o. male.   Chief Complaint: Bilateral lower extremity pain HPI: -year-old male with symptoms of neurogenic claudication with bilateral lower chamois pain and weakness with prolonged standing or walking. Workup demonstrates evidence of significant stenosis at L3-4 and L4-5. Patient presents now for decompression surgery.  Past Medical History  Diagnosis Date  . Anxiety   . Osteoarthritis   . History of foot fracture   . Chronic back pain   . Essential hypertension, benign     takes Lisinopril daily but forgets on occasion  . Headache   . Hyperlipidemia     hx of;has been off of Lipitor 6-11yrs   . Shortness of breath     with exertion  . Migraine     last one a week ago  . Seizures     has been off of Dilantin 64yr;last seizure 58yrs ago  . Joint disease   . Chronic back pain     pinced nerve  . Chronic neck pain     pinched nerve  . Depression     doesn't require meds    Past Surgical History  Procedure Date  . Posterior cervical laminectomy 04/18/2012    Procedure: POSTERIOR CERVICAL LAMINECTOMY;  Surgeon: Temple Pacini, MD;  Location: [facility];  Service: Neurosurgery;  Laterality: Right;  Right Cervical Seven-thoracic One Lamicectomy/Foraminotomy/Microdiscectomy    Family History  Problem Relation Age of Onset  . Hypertension Mother   . Stroke Mother   . Diabetes type II Brother   . Coronary artery disease Brother   . Diabetes type II Sister   . Anesthesia problems Neg Hx   . Hypotension Neg Hx   . Malignant hyperthermia Neg Hx   . Pseudochol deficiency Neg Hx    Social History:  reports that he has been smoking Cigarettes and Cigars.  He has a 22 pack-year smoking history. He has never used smokeless tobacco. He reports that he drinks about .6 ounces of alcohol per week. He reports that he uses illicit drugs (Marijuana).  Allergies: No Known Allergies  Medications Prior to Admission  Medication Sig Dispense Refill  . cyclobenzaprine  (FLEXERIL) 10 MG tablet Take 10 mg by mouth 3 (three) times daily as needed. For pain      . HYDROcodone-acetaminophen (NORCO) 10-325 MG per tablet Take 1 tablet by mouth every 8 (eight) hours as needed. For pain      . lisinopril (PRINIVIL,ZESTRIL) 10 MG tablet Take 10 mg by mouth daily.      . naproxen sodium (ANAPROX) 220 MG tablet Take 220 mg by mouth 2 (two) times daily as needed. For pain        No results found for this or any previous visit (from the past 48 hour(s)). No results found.  Review of Systems  Constitutional: Negative.   HENT: Negative.   Eyes: Negative.   Respiratory: Negative.   Cardiovascular: Negative.   Gastrointestinal: Negative.   Genitourinary: Negative.   Musculoskeletal: Negative.   Skin: Negative.   Neurological: Negative.   Endo/Heme/Allergies: Negative.   Psychiatric/Behavioral: Negative.     Blood pressure 114/67, pulse 63, temperature 98.3 F (36.8 C), temperature source Oral, resp. rate 18, SpO2 99.00%. Physical Exam  Constitutional: He is oriented to person, place, and time. He appears well-developed and well-nourished.  HENT:  Head: Normocephalic and atraumatic.  Right Ear: External ear normal.  Left Ear: External ear normal.  Nose: Nose normal.  Mouth/Throat: Oropharynx is clear and moist.  Eyes: Conjunctivae and EOM are normal. Pupils are equal, round, and reactive to light.  Neck: Normal range of motion. Neck supple.  Cardiovascular: Normal rate, regular rhythm, normal heart sounds and intact distal pulses.   Respiratory: Effort normal and breath sounds normal. No respiratory distress. He has no wheezes.  GI: Soft. Bowel sounds are normal. He exhibits no distension. There is no tenderness.  Musculoskeletal: Normal range of motion. He exhibits no edema and no tenderness.  Neurological: He is alert and oriented to person, place, and time. He has normal reflexes. No cranial nerve deficit. Coordination normal.  Skin: Skin is warm and dry.  No rash noted. No erythema. No pallor.  Psychiatric: He has a normal mood and affect. His behavior is normal. Judgment and thought content normal.     Assessment/Plan L3-4, L4-5 stenosis with neurogenic claudication. Plan bilateral L3-4 and L4-5 decompressive laminotomies and foraminotomies. Risks and benefits have been explained. Patient wishes to proceed.  [name] A 05/29/2012, 7:43 AM

## 2012-05-29 NOTE — Op Note (Signed)
Date of procedure: 05/29/2012  Date of dictation: Same  Service: Neurosurgery  Preoperative diagnosis: L3-4, L4-5 stenosis with neurogenic claudication  Postoperative diagnosis: Same  Procedure Name: Bilateral L3-4 and L4-5 decompressive laminotomies with bilateral L3, L4, L5 decompressive foraminotomies.  Repair of unintended durotomy  Surgeon:Damariz Paganelli A.Ouita Nish, M.D.  Asst. Surgeon: Colon Branch  Anesthesia: General  Indication: 61 year old male with severe neurogenic claudication secondary to L3-4 and L4-5 stenosis presents now for bilateral decompressive surgery.  Operative note: After induction of anesthesia, patient positioned prone onto Wilson frame and her purply padded. Lumbar region prepped and draped. Incision made over L3-4 level. Supper off dissection performed closing lamina and facet joints of L3 L4-L5 bilaterally. Self-retaining retractors placed x-rays taken level confirmed laminotomies performed using high-speed drill Kerrison rongeurs to remove the inferior aspect of lamina above medial aspect of the facet joint and the superior aspect of lamina below. Ligamentum flavum was elevated and resected these will fashion. Underlying thecal sac was identified. Wide decompressive foraminotomies were then performed on course and exiting L3-L4 and L5 nerve roots bilaterally. In the course of elevating the ligamentum flavum on the right side at L4-5 a small linear unintended durotomy was encountered. Minimal CSF was found to leak from this hole. The dura was then oversewn with a 6-0 Prolene. This controlled the CSF leak. DuraSeal fibrin glue sealant was then placed over the dural repair. Gelfoam was placed over each laminotomy site. Wound was then irrigated out solution. Is then closed in layers with Vicryl sutures. Steri-Strips triggers were applied. There were no apparent complications. Patient tolerated well and returns to the recovery room postop.

## 2012-05-29 NOTE — Progress Notes (Signed)
Dr. Jordan Likes informed of patient's complaint of slight numbness in finger tips of both hands; no further orders received.

## 2012-05-29 NOTE — Progress Notes (Signed)
Pt. States he had 1/4 cup black coffee with meds at 0400 this AM, Dr. Jacklynn Bue notified.

## 2012-05-30 MED ORDER — OXYCODONE-ACETAMINOPHEN 5-325 MG PO TABS: 1.0000 | ORAL_TABLET | ORAL | Status: AC | PRN

## 2012-05-30 MED ORDER — CYCLOBENZAPRINE HCL 10 MG PO TABS: 10.0000 mg | ORAL_TABLET | Freq: Three times a day (TID) | ORAL | Status: AC | PRN

## 2012-05-30 NOTE — Discharge Summary (Signed)
Physician Discharge Summary  Patient ID: Arthur Wilcox MRN: [id number] DOB/AGE: 1951-01-27 61 y.o.  Admit date: 05/29/2012 Discharge date: 05/30/2012  Admission Diagnoses:  Discharge Diagnoses:  Principal Problem:  *Lumbar stenosis with neurogenic claudication   Discharged Condition: good  Hospital Course: Patient admitted to the hospital where he underwent a bilateral L3-4 and L4-5 decompressive laminotomies with foraminotomies. Postoperatively he is done well. Preoperative back and lower trimming pain her much improved. Wound healing well. No other problems. Plan for discharge home.  Consults:   Significant Diagnostic Studies:   Treatments:   Discharge Exam: Blood pressure 112/65, pulse 67, temperature 98.2 F (36.8 C), temperature source Oral, resp. rate 20, SpO2 100.00%. Awake and alert oriented and appropriate. Cranial nerve function is intact. Motor and sensory function of the extremities is normal. Wound clean dry and intact. Chest and abdomen benign.  Disposition: 01-Home or Self Care   Medication List  As of 05/30/2012 10:04 AM   STOP taking these medications         HYDROcodone-acetaminophen 10-325 MG per tablet         TAKE these medications         cyclobenzaprine 10 MG tablet   Commonly known as: FLEXERIL   Take 1 tablet (10 mg total) by mouth 3 (three) times daily as needed for muscle spasms.      cyclobenzaprine 10 MG tablet   Commonly known as: FLEXERIL   Take 10 mg by mouth 3 (three) times daily as needed. For pain      lisinopril 10 MG tablet   Commonly known as: PRINIVIL,ZESTRIL   Take 10 mg by mouth daily.      naproxen sodium 220 MG tablet   Commonly known as: ANAPROX   Take 220 mg by mouth 2 (two) times daily as needed. For pain      oxyCODONE-acetaminophen 5-325 MG per tablet   Commonly known as: PERCOCET   Take 1-2 tablets by mouth every 4 (four) hours as needed for pain.           Follow-up Information    Follow up with Rene Sizelove  A, MD. Call in 1 week. (Ask for Lurena Joiner)    Contact information:   [address] [place] [address] [phone]          Signed: Temple Pacini 05/30/2012, 10:04 AM

## 2012-05-30 NOTE — Discharge Instructions (Signed)
Wound Care Keep incision covered and dry for one week.  If you shower prior to then, cover incision with plastic wrap.  You may remove outer bandage after one week and shower.  Do not put any creams, lotions, or ointments on incision. Leave steri-strips on back.  They will fall off by themselves. Activity Walk each and every day, increasing distance each day. No lifting greater than 5 lbs.  Avoid excessive neck motion. No driving for 2 weeks; may ride as a passenger locally. If provided with back brace, wear when out of bed.  It is not necessary to wear brace in bed. Diet Resume your normal diet.  Return to Work Will be discussed at you follow up appointment. Call Your Doctor If Any of These Occur Redness, drainage, or swelling at the wound.  Temperature greater than 101 degrees. Severe pain not relieved by pain medication. Incision starts to come apart. Follow Up Appt Call today for appointment in 1-2 weeks ([phone]) or for problems.  If you have any hardware placed in your spine, you will need an x-ray before your appointment.

## 2012-06-01 ENCOUNTER — Encounter (HOSPITAL_COMMUNITY): Payer: Self-pay | Admitting: Neurosurgery

## 2012-06-16 ENCOUNTER — Encounter (HOSPITAL_COMMUNITY): Payer: Self-pay | Admitting: Emergency Medicine

## 2012-06-16 ENCOUNTER — Emergency Department (HOSPITAL_COMMUNITY)
Admission: EM | Admit: 2012-06-16 | Discharge: 2012-06-17 | Disposition: A | Discharge status: Home / Self Care | Payer: Medicaid Other | Attending: Emergency Medicine | Admitting: Emergency Medicine

## 2012-06-16 DIAGNOSIS — F329 Major depressive disorder, single episode, unspecified: Secondary | ICD-10-CM | POA: Insufficient documentation

## 2012-06-16 DIAGNOSIS — R1909 Other intra-abdominal and pelvic swelling, mass and lump: Secondary | ICD-10-CM | POA: Insufficient documentation

## 2012-06-16 DIAGNOSIS — K921 Melena: Secondary | ICD-10-CM | POA: Insufficient documentation

## 2012-06-16 DIAGNOSIS — K644 Residual hemorrhoidal skin tags: Secondary | ICD-10-CM | POA: Insufficient documentation

## 2012-06-16 DIAGNOSIS — E785 Hyperlipidemia, unspecified: Secondary | ICD-10-CM | POA: Insufficient documentation

## 2012-06-16 DIAGNOSIS — R109 Unspecified abdominal pain: Secondary | ICD-10-CM | POA: Insufficient documentation

## 2012-06-16 DIAGNOSIS — F3289 Other specified depressive episodes: Secondary | ICD-10-CM | POA: Insufficient documentation

## 2012-06-16 DIAGNOSIS — L0390 Cellulitis, unspecified: Secondary | ICD-10-CM | POA: Insufficient documentation

## 2012-06-16 DIAGNOSIS — M1990 Unspecified osteoarthritis, unspecified site: Secondary | ICD-10-CM | POA: Insufficient documentation

## 2012-06-16 DIAGNOSIS — I10 Essential (primary) hypertension: Secondary | ICD-10-CM | POA: Insufficient documentation

## 2012-06-16 DIAGNOSIS — Z79899 Other long term (current) drug therapy: Secondary | ICD-10-CM | POA: Insufficient documentation

## 2012-06-16 DIAGNOSIS — L0291 Cutaneous abscess, unspecified: Secondary | ICD-10-CM | POA: Insufficient documentation

## 2012-06-16 DIAGNOSIS — G40909 Epilepsy, unspecified, not intractable, without status epilepticus: Secondary | ICD-10-CM | POA: Insufficient documentation

## 2012-06-16 DIAGNOSIS — G8929 Other chronic pain: Secondary | ICD-10-CM | POA: Insufficient documentation

## 2012-06-16 DIAGNOSIS — R110 Nausea: Secondary | ICD-10-CM | POA: Insufficient documentation

## 2012-06-16 DIAGNOSIS — K5900 Constipation, unspecified: Secondary | ICD-10-CM | POA: Insufficient documentation

## 2012-06-16 DIAGNOSIS — M549 Dorsalgia, unspecified: Secondary | ICD-10-CM | POA: Insufficient documentation

## 2012-06-16 DIAGNOSIS — K625 Hemorrhage of anus and rectum: Secondary | ICD-10-CM | POA: Insufficient documentation

## 2012-06-16 LAB — URINALYSIS, ROUTINE W REFLEX MICROSCOPIC
Glucose, UA: NEGATIVE mg/dL
Leukocytes, UA: NEGATIVE
Nitrite: NEGATIVE
pH: 6.5 (ref 5.0–8.0)

## 2012-06-16 LAB — CBC
HCT: 38.4 % — ABNORMAL LOW (ref 39.0–52.0)
MCH: 30.9 pg (ref 26.0–34.0)
MCHC: 34.9 g/dL (ref 30.0–36.0)
MCV: 88.5 fL (ref 78.0–100.0)
RDW: 13 % (ref 11.5–15.5)

## 2012-06-16 LAB — COMPREHENSIVE METABOLIC PANEL
Albumin: 3.5 g/dL (ref 3.5–5.2)
BUN: 14 mg/dL (ref 6–23)
Calcium: 9.8 mg/dL (ref 8.4–10.5)
Creatinine, Ser: 1.07 mg/dL (ref 0.50–1.35)
GFR calc Af Amer: 87 mL/min — ABNORMAL LOW (ref >90)
Glucose, Bld: 110 mg/dL — ABNORMAL HIGH (ref 70–99)
Total Protein: 8.1 g/dL (ref 6.0–8.3)

## 2012-06-16 LAB — OCCULT BLOOD, POC DEVICE: Fecal Occult Bld: POSITIVE

## 2012-06-16 MED ORDER — HYDROMORPHONE HCL PF 1 MG/ML IJ SOLN
1.0000 mg | Freq: Once | INTRAMUSCULAR | Status: AC
  Administered 2012-06-16: 1 mg via INTRAVENOUS
  Filled 2012-06-16: qty 1

## 2012-06-16 MED ORDER — SODIUM CHLORIDE 0.9 % IV BOLUS (SEPSIS)
1000.0000 mL | Freq: Once | INTRAVENOUS | Status: AC
  Administered 2012-06-16: 1000 mL via INTRAVENOUS

## 2012-06-16 MED ORDER — IOHEXOL 300 MG/ML  SOLN
20.0000 mL | INTRAMUSCULAR | Status: AC
  Administered 2012-06-16: 20 mL via ORAL

## 2012-06-16 MED ORDER — ONDANSETRON HCL 4 MG/2ML IJ SOLN
4.0000 mg | Freq: Once | INTRAMUSCULAR | Status: AC
  Administered 2012-06-16: 4 mg via INTRAVENOUS
  Filled 2012-06-16: qty 2

## 2012-06-16 NOTE — ED Provider Notes (Signed)
History     CSN: [id number]  Arrival date & time 06/16/12  1733   First MD Initiated Contact with Patient 06/16/12 2158      Chief Complaint  Patient presents with  . Rectal Bleeding  . Abdominal Pain    (Consider location/radiation/quality/duration/timing/severity/associated sxs/prior treatment) HPI Comments: Patient presenting today with a chief complaint of pain in his right groin.  Patient reports that the pain has been present for months, but has worsened over the past 2 days. Pain has been constant over the last 2 days.   He also reports that he has some swelling of the right groin two days ago, which has since resolved.  He reports that he is feeling nauseous, but denies vomiting.  He denies diarrhea.  He reports that he did notice bright red blood in his stool two days ago.  Blood mixed in with stool.  He reports that he has been more constipated recently and has had to strain with bowel movements.  Mild pain with BM.  He denies dysuria.  Denies hematuria.  He reports that he started having a fever earlier today.    Patient also reports that he had bilateral L3-L4 and L4-L5 decompressive laminotomies and foraminotomies done on May 30, 2012.  Surgery done by Dr. Dutch Quint.  He reports that he has had some swelling at the incision site, but the swelling has improved.  Patient's wife reports that the area has looked more red to her today.  Patient reports that the area is painful.    The history is provided by the patient.    Past Medical History  Diagnosis Date  . Anxiety   . Osteoarthritis   . History of foot fracture   . Chronic back pain   . Essential hypertension, benign     takes Lisinopril daily but forgets on occasion  . Headache   . Hyperlipidemia     hx of;has been off of Lipitor 6-17yrs   . Shortness of breath     with exertion  . Migraine     last one a week ago  . Seizures     has been off of Dilantin 5yr;last seizure 76yrs ago  . Joint disease   . Chronic back  pain     pinced nerve  . Chronic neck pain     pinched nerve  . Depression     doesn't require meds    Past Surgical History  Procedure Date  . Posterior cervical laminectomy 04/18/2012    Procedure: POSTERIOR CERVICAL LAMINECTOMY;  Surgeon: Temple Pacini, MD;  Location: [facility];  Service: Neurosurgery;  Laterality: Right;  Right Cervical Seven-thoracic One Lamicectomy/Foraminotomy/Microdiscectomy  . Lumbar laminectomy/decompression microdiscectomy 05/29/2012    Procedure: LUMBAR LAMINECTOMY/DECOMPRESSION MICRODISCECTOMY 2 LEVELS;  Surgeon: Temple Pacini, MD;  Location: [facility];  Service: Neurosurgery;  Laterality: Bilateral;  Bilateral Lumbar three through five decompressive lumbar laminectomy     Family History  Problem Relation Age of Onset  . Hypertension Mother   . Stroke Mother   . Diabetes type II Brother   . Coronary artery disease Brother   . Diabetes type II Sister   . Anesthesia problems Neg Hx   . Hypotension Neg Hx   . Malignant hyperthermia Neg Hx   . Pseudochol deficiency Neg Hx     History  Substance Use Topics  . Smoking status: Current Everyday Smoker -- 0.5 packs/day for 44 years    Types: Cigarettes, Cigars  . Smokeless tobacco:  Never Used  . Alcohol Use: 0.6 oz/week    1 Cans of beer per week     Regular intake-gin      Review of Systems  Constitutional: Negative for fever and chills.  Respiratory: Negative for shortness of breath.   Cardiovascular: Negative for chest pain.  Gastrointestinal: Positive for nausea and abdominal pain. Negative for vomiting, diarrhea, constipation, blood in stool and abdominal distention.  Genitourinary: Negative for dysuria, urgency, hematuria, flank pain, decreased urine volume, penile swelling, scrotal swelling, penile pain and testicular pain.  Musculoskeletal: Positive for back pain. Negative for gait problem.  Skin: Positive for color change.    Allergies  Review of patient's allergies indicates no  known allergies.  Home Medications   Current Outpatient Rx  Name Route Sig Dispense Refill  . CLONAZEPAM 0.5 MG PO TABS Oral Take 0.5 mg by mouth daily.    . CYCLOBENZAPRINE HCL 10 MG PO TABS Oral Take 10 mg by mouth 3 (three) times daily as needed. For pain    . LISINOPRIL 10 MG PO TABS Oral Take 10 mg by mouth daily.    Marland Kitchen NAPROXEN SODIUM 220 MG PO TABS Oral Take 220 mg by mouth 2 (two) times daily as needed. For pain      BP 120/64  Pulse 95  Temp 99.7 F (37.6 C) (Oral)  Resp 20  SpO2 100%  Physical Exam  Nursing note and vitals reviewed. Constitutional: He appears well-developed and well-nourished. No distress.  HENT:  Head: Normocephalic and atraumatic.  Mouth/Throat: Oropharynx is clear and moist.  Neck: Normal range of motion. Neck supple.  Cardiovascular: Normal rate, regular rhythm and normal heart sounds.   Pulmonary/Chest: Effort normal and breath sounds normal. No respiratory distress. He has no wheezes.  Abdominal: Soft. Bowel sounds are normal. He exhibits no distension and no mass. There is tenderness. There is no rigidity, no rebound, no tenderness at McBurney's point and negative Murphy's sign. Hernia confirmed negative in the right inguinal area and confirmed negative in the left inguinal area.    Genitourinary: Testes normal and penis normal. Right testis shows no mass, no swelling and no tenderness. Right testis is descended. Left testis shows no mass, no swelling and no tenderness. Left testis is descended. Uncircumcised. No penile erythema. No discharge found.  Musculoskeletal: Normal range of motion.  Neurological: He is alert.  Skin: Skin is warm and dry. He is not diaphoretic. No erythema.  Psychiatric: He has a normal mood and affect.    ED Course  Procedures (including critical care time)  Labs Reviewed  CBC - Abnormal; Notable for the following:    WBC 14.2 (*)     HCT 38.4 (*)     All other components within normal limits  COMPREHENSIVE  METABOLIC PANEL - Abnormal; Notable for the following:    Sodium 132 (*)     Chloride 92 (*)     Glucose, Bld 110 (*)     GFR calc non Af Amer 75 (*)     GFR calc Af Amer 87 (*)     All other components within normal limits  URINALYSIS, ROUTINE W REFLEX MICROSCOPIC  OCCULT BLOOD, POC DEVICE   Ct Abdomen Pelvis W Contrast  06/17/2012  *RADIOLOGY REPORT*  Clinical Data: Recent back surgery, bilateral lower quadrant abdominal pain.  CT ABDOMEN AND PELVIS WITH CONTRAST  Technique:  Multidetector CT imaging of the abdomen and pelvis was performed following the standard protocol during bolus administration of intravenous contrast.  Contrast: OMNIPAQUE IOHEXOL 300 MG/ML  SOLN  Comparison: None.  Findings: Mild dependent atelectasis.  Otherwise lung bases are clear.  Normal heart size.  No pleural or pericardial effusion.  Unremarkable liver, biliary system, spleen, adrenal glands.  Mild fullness of the main pancreatic duct, measuring up to 4 mm.  An obstructing lesion is not identified.  Symmetric renal enhancement.  No hydronephrosis or hydroureter.  No bowel obstruction.  No CT evidence for colitis.  Normal appendix.  No free intraperitoneal air or fluid.  No lymphadenopathy.  There is scattered atherosclerotic calcification of the aorta and its branches. No aneurysmal dilatation.  Thin-walled bladder.  Prostatomegaly.  Nonspecific seminal vesicle calcifications.  Postoperative changes involving the lower lumbar spine.  There is a subcutaneous fat stranding along the back posteriorly and a 2.7 x 2.9 cm fluid collection posterior to the spinous process of L3.  IMPRESSION: Postoperative changes in keeping with the stated history of recent lumbar surgery.  There is a loculated fluid collection posterior to the L3 spinous process of indeterminate sterility, measuring 2.7 x 2.9 cm.  No acute intraperitoneal process identified.  Prominence of the main pancreatic duct up to 4 mm.  No obstructing lesion  identified by CT.  ERCP or MRCP could be obtained to better characterize.  Original Report Authenticated By: Waneta Martins, M.D.     1. Groin pain, right lower quadrant   2. Constipation   3. External hemorrhoid   4. Abscess       MDM  Patient presenting with fever and right groin pain.  CT results as above.  CT showing prominence of the pancreatic duct.  However, patient is presenting with lower abdominal pain, no epigastric or upper abdominal pain.  No vomiting.  CT also showing fluid collection posterior to L3.  Dr. Hyacinth Meeker consulted Neurosurgery and it was recommended that patient be placed on Bactrim bid for 14 days.  Patient instructed to follow up with Neurosurgery.  Patient discharged.  Return precautions discussed.        Pascal Lux [place], PA-C 06/18/12 [phone]

## 2012-06-16 NOTE — ED Notes (Signed)
C/o blood in stool, RLQ pain, and R groin pain since Wednesday.

## 2012-06-16 NOTE — ED Notes (Signed)
Jackie Triage RN notified of temperature of 101.0

## 2012-06-16 NOTE — ED Notes (Signed)
Patient updated on status, continue to await for bed in back, Charge RN Clydie Braun notified of patient's fever.  Patient updated.

## 2012-06-16 NOTE — ED Notes (Signed)
Pt states he has had 3 episodes of blood in stool and 1 episode of wiping blood. Pt states that stool looked "coated" in a dark red blood, a lot the first time and a minimal amount the other times. Pt states abd pain is in RLQ and "trying to hurt" in the LLQ. Pt states abd is swollen on right side. Pt denies n/v, dizziness. Pt states he has noticed weakness and frequent headaches. NAD noted at this time.

## 2012-06-17 ENCOUNTER — Encounter (HOSPITAL_COMMUNITY): Payer: Self-pay | Admitting: Radiology

## 2012-06-17 ENCOUNTER — Emergency Department (HOSPITAL_COMMUNITY): Payer: Medicaid Other

## 2012-06-17 MED ORDER — SULFAMETHOXAZOLE-TRIMETHOPRIM 800-160 MG PO TABS: 1.0000 | ORAL_TABLET | Freq: Two times a day (BID) | ORAL | Status: DC

## 2012-06-17 MED ORDER — IOHEXOL 300 MG/ML  SOLN
100.0000 mL | Freq: Once | INTRAMUSCULAR | Status: AC | PRN
  Administered 2012-06-17: 100 mL via INTRAVENOUS

## 2012-06-17 MED ORDER — DOCUSATE SODIUM 100 MG PO CAPS
100.0000 mg | ORAL_CAPSULE | Freq: Two times a day (BID) | ORAL | Status: DC
Start: 2012-06-17 — End: 2012-06-19

## 2012-06-17 MED ORDER — POLYETHYLENE GLYCOL 3350 17 GM/SCOOP PO POWD: 17.0000 g | Freq: Every day | ORAL | Status: DC

## 2012-06-17 NOTE — ED Notes (Signed)
Pt with hx of recent lumbar surgery with onset of lower abd pain, on my exam the patient has bilateral inguinal pain, no hernias present on my exam. Abdomen is overall soft and nontender. His lower back stat the surgical site shows some erythema with a fluid collection, no draining from the wound.  CT scan results reviewed with the patient and with the neurosurgeon on call, seat recommendations below Dr. Danielle Dess - recommends Bactrim and f/u in office as Treatment of choice.  F/u in office this week.  F/u next week.   , Bactrim twice a day, 2 weeks, followup with neurosurgery early this week.  Medical screening examination/treatment/procedure(s) were conducted as a shared visit with non-physician practitioner(s) and myself.  I personally evaluated the patient during the encounter    Vida Roller, MD 06/17/12 [phone]

## 2012-06-18 ENCOUNTER — Emergency Department: Payer: Self-pay | Admitting: Emergency Medicine

## 2012-06-18 LAB — COMPREHENSIVE METABOLIC PANEL
Albumin: 3.3 g/dL — ABNORMAL LOW (ref 3.4–5.0)
Anion Gap: 9 (ref 7–16)
Calcium, Total: 9.4 mg/dL (ref 8.5–10.1)
Co2: 27 mmol/L (ref 21–32)
EGFR (African American): >60
Glucose: 88 mg/dL (ref 65–99)
SGPT (ALT): 20 U/L
Sodium: 134 mmol/L — ABNORMAL LOW (ref 136–145)

## 2012-06-18 LAB — PROTIME-INR: INR: 1

## 2012-06-18 LAB — CBC
HCT: 33.9 % — ABNORMAL LOW (ref 40.0–52.0)
MCHC: 34 g/dL (ref 32.0–36.0)
Platelet: 307 10*3/uL (ref 150–440)
RDW: 13.8 % (ref 11.5–14.5)

## 2012-06-19 ENCOUNTER — Inpatient Hospital Stay (HOSPITAL_COMMUNITY)
Admission: EM | Admit: 2012-06-19 | Discharge: 2012-06-23 | DRG: 856 | Disposition: A | Discharge status: Home Health | Payer: Medicaid Other | Source: Other Acute Inpatient Hospital | Attending: Neurosurgery | Admitting: Neurosurgery

## 2012-06-19 ENCOUNTER — Inpatient Hospital Stay (HOSPITAL_COMMUNITY): Payer: Medicaid Other | Admitting: Anesthesiology

## 2012-06-19 ENCOUNTER — Inpatient Hospital Stay: Admit: 2012-06-19 | Payer: Self-pay | Admitting: Neurosurgery

## 2012-06-19 ENCOUNTER — Inpatient Hospital Stay (HOSPITAL_COMMUNITY): Payer: Medicaid Other

## 2012-06-19 ENCOUNTER — Encounter (HOSPITAL_COMMUNITY)
Admission: EM | Disposition: A | Discharge status: Home Health | Payer: Self-pay | Source: Other Acute Inpatient Hospital | Attending: Neurosurgery

## 2012-06-19 ENCOUNTER — Encounter (HOSPITAL_COMMUNITY): Payer: Self-pay | Admitting: Anesthesiology

## 2012-06-19 ENCOUNTER — Encounter (HOSPITAL_COMMUNITY): Payer: Self-pay | Admitting: General Practice

## 2012-06-19 DIAGNOSIS — F17200 Nicotine dependence, unspecified, uncomplicated: Secondary | ICD-10-CM | POA: Diagnosis present

## 2012-06-19 DIAGNOSIS — Z79899 Other long term (current) drug therapy: Secondary | ICD-10-CM

## 2012-06-19 DIAGNOSIS — I10 Essential (primary) hypertension: Secondary | ICD-10-CM | POA: Diagnosis present

## 2012-06-19 DIAGNOSIS — F411 Generalized anxiety disorder: Secondary | ICD-10-CM | POA: Diagnosis present

## 2012-06-19 DIAGNOSIS — D72829 Elevated white blood cell count, unspecified: Secondary | ICD-10-CM | POA: Diagnosis present

## 2012-06-19 DIAGNOSIS — Y838 Other surgical procedures as the cause of abnormal reaction of the patient, or of later complication, without mention of misadventure at the time of the procedure: Secondary | ICD-10-CM | POA: Diagnosis present

## 2012-06-19 DIAGNOSIS — M1990 Unspecified osteoarthritis, unspecified site: Secondary | ICD-10-CM | POA: Diagnosis present

## 2012-06-19 DIAGNOSIS — T8140XA Infection following a procedure, unspecified, initial encounter: Principal | ICD-10-CM | POA: Diagnosis present

## 2012-06-19 DIAGNOSIS — A4902 Methicillin resistant Staphylococcus aureus infection, unspecified site: Secondary | ICD-10-CM | POA: Diagnosis present

## 2012-06-19 DIAGNOSIS — E785 Hyperlipidemia, unspecified: Secondary | ICD-10-CM | POA: Diagnosis present

## 2012-06-19 DIAGNOSIS — G061 Intraspinal abscess and granuloma: Secondary | ICD-10-CM | POA: Diagnosis present

## 2012-06-19 DIAGNOSIS — T8149XA Infection following a procedure, other surgical site, initial encounter: Secondary | ICD-10-CM | POA: Diagnosis present

## 2012-06-19 DIAGNOSIS — R509 Fever, unspecified: Secondary | ICD-10-CM | POA: Diagnosis present

## 2012-06-19 DIAGNOSIS — F329 Major depressive disorder, single episode, unspecified: Secondary | ICD-10-CM | POA: Diagnosis present

## 2012-06-19 DIAGNOSIS — F3289 Other specified depressive episodes: Secondary | ICD-10-CM | POA: Diagnosis present

## 2012-06-19 HISTORY — PX: LUMBAR WOUND DEBRIDEMENT: SHX1988

## 2012-06-19 LAB — SEDIMENTATION RATE: Sed Rate: 122 mm/hr — ABNORMAL HIGH (ref 0–16)

## 2012-06-19 SURGERY — LUMBAR WOUND DEBRIDEMENT
Anesthesia: General | Site: Spine Lumbar | Wound class: Dirty or Infected

## 2012-06-19 MED ORDER — OXYCODONE-ACETAMINOPHEN 5-325 MG PO TABS
1.0000 | ORAL_TABLET | ORAL | Status: DC | PRN
  Administered 2012-06-19 – 2012-06-23 (×15): 2 via ORAL
  Filled 2012-06-19 (×15): qty 2

## 2012-06-19 MED ORDER — NALOXONE HCL 0.4 MG/ML IJ SOLN
INTRAMUSCULAR | Status: DC | PRN
  Administered 2012-06-19: 80 ug via INTRAVENOUS

## 2012-06-19 MED ORDER — PROPOFOL 10 MG/ML IV BOLUS
INTRAVENOUS | Status: DC | PRN
  Administered 2012-06-19: 150 mg via INTRAVENOUS

## 2012-06-19 MED ORDER — HYDROMORPHONE HCL PF 1 MG/ML IJ SOLN
0.5000 mg | INTRAMUSCULAR | Status: DC | PRN
  Administered 2012-06-19 – 2012-06-21 (×9): 1 mg via INTRAVENOUS
  Filled 2012-06-19 (×8): qty 1

## 2012-06-19 MED ORDER — SUCCINYLCHOLINE CHLORIDE 20 MG/ML IJ SOLN
INTRAMUSCULAR | Status: DC | PRN
  Administered 2012-06-19: 100 mg via INTRAVENOUS

## 2012-06-19 MED ORDER — FLEET ENEMA 7-19 GM/118ML RE ENEM: 1.0000 | ENEMA | Freq: Once | RECTAL | Status: AC | PRN

## 2012-06-19 MED ORDER — POLYETHYLENE GLYCOL 3350 17 GM/SCOOP PO POWD
17.0000 g | Freq: Every day | ORAL | Status: DC
  Filled 2012-06-19: qty 255

## 2012-06-19 MED ORDER — ACETAMINOPHEN 325 MG PO TABS: 650.0000 mg | ORAL_TABLET | ORAL | Status: DC | PRN

## 2012-06-19 MED ORDER — VANCOMYCIN HCL 1000 MG IV SOLR
1000.0000 mg | INTRAVENOUS | Status: DC | PRN
  Administered 2012-06-19: 1000 mg via INTRAVENOUS

## 2012-06-19 MED ORDER — HYDROCODONE-ACETAMINOPHEN 5-325 MG PO TABS: 1.0000 | ORAL_TABLET | ORAL | Status: DC | PRN

## 2012-06-19 MED ORDER — SODIUM CHLORIDE 0.9 % IJ SOLN: 3.0000 mL | Freq: Two times a day (BID) | INTRAMUSCULAR | Status: DC

## 2012-06-19 MED ORDER — ACETAMINOPHEN 650 MG RE SUPP: 650.0000 mg | RECTAL | Status: DC | PRN

## 2012-06-19 MED ORDER — CYCLOBENZAPRINE HCL 10 MG PO TABS: 10.0000 mg | ORAL_TABLET | Freq: Three times a day (TID) | ORAL | Status: DC | PRN

## 2012-06-19 MED ORDER — GADOBENATE DIMEGLUMINE 529 MG/ML IV SOLN
14.0000 mL | Freq: Once | INTRAVENOUS | Status: AC
  Administered 2012-06-19: 14 mL via INTRAVENOUS

## 2012-06-19 MED ORDER — MENTHOL 3 MG MT LOZG: 1.0000 | LOZENGE | OROMUCOSAL | Status: DC | PRN

## 2012-06-19 MED ORDER — POTASSIUM CHLORIDE IN NACL 20-0.9 MEQ/L-% IV SOLN
INTRAVENOUS | Status: DC
  Administered 2012-06-19: 03:00:00 via INTRAVENOUS
  Filled 2012-06-19 (×3): qty 1000

## 2012-06-19 MED ORDER — CYCLOBENZAPRINE HCL 10 MG PO TABS
10.0000 mg | ORAL_TABLET | Freq: Three times a day (TID) | ORAL | Status: DC | PRN
  Administered 2012-06-19 – 2012-06-23 (×6): 10 mg via ORAL
  Filled 2012-06-19 (×6): qty 1

## 2012-06-19 MED ORDER — BISACODYL 10 MG RE SUPP
10.0000 mg | Freq: Every day | RECTAL | Status: DC | PRN
  Administered 2012-06-21 – 2012-06-22 (×2): 10 mg via RECTAL
  Filled 2012-06-19 (×2): qty 1

## 2012-06-19 MED ORDER — KETOROLAC TROMETHAMINE 30 MG/ML IJ SOLN
30.0000 mg | Freq: Four times a day (QID) | INTRAMUSCULAR | Status: DC
  Administered 2012-06-19 – 2012-06-23 (×14): 30 mg via INTRAVENOUS
  Filled 2012-06-19 (×23): qty 1

## 2012-06-19 MED ORDER — SODIUM CHLORIDE 0.9 % IV SOLN
250.0000 mL | INTRAVENOUS | Status: DC
  Administered 2012-06-19: 250 mL via INTRAVENOUS

## 2012-06-19 MED ORDER — ONDANSETRON HCL 4 MG/2ML IJ SOLN: 4.0000 mg | INTRAMUSCULAR | Status: DC | PRN

## 2012-06-19 MED ORDER — DEXTROSE 5 % IV SOLN
2.0000 g | INTRAVENOUS | Status: DC
  Filled 2012-06-19: qty 2

## 2012-06-19 MED ORDER — THROMBIN 5000 UNITS EX KIT
PACK | CUTANEOUS | Status: DC | PRN
  Administered 2012-06-19 (×2): 5000 [IU] via TOPICAL

## 2012-06-19 MED ORDER — POLYETHYLENE GLYCOL 3350 17 G PO PACK
17.0000 g | PACK | Freq: Every day | ORAL | Status: DC
  Administered 2012-06-19: 17 g via ORAL
  Filled 2012-06-19: qty 1

## 2012-06-19 MED ORDER — SODIUM CHLORIDE 0.9 % IJ SOLN
3.0000 mL | Freq: Two times a day (BID) | INTRAMUSCULAR | Status: DC
  Administered 2012-06-19 – 2012-06-20 (×3): 3 mL via INTRAVENOUS
  Administered 2012-06-21: 11:00:00 via INTRAVENOUS

## 2012-06-19 MED ORDER — DOCUSATE SODIUM 100 MG PO CAPS
100.0000 mg | ORAL_CAPSULE | Freq: Two times a day (BID) | ORAL | Status: DC
  Administered 2012-06-19: 100 mg via ORAL
  Filled 2012-06-19: qty 1

## 2012-06-19 MED ORDER — OXYCODONE-ACETAMINOPHEN 5-325 MG PO TABS
1.0000 | ORAL_TABLET | ORAL | Status: DC | PRN
  Administered 2012-06-19 (×2): 2 via ORAL
  Filled 2012-06-19 (×2): qty 2

## 2012-06-19 MED ORDER — HYDROMORPHONE HCL PF 1 MG/ML IJ SOLN: 0.2500 mg | INTRAMUSCULAR | Status: DC | PRN

## 2012-06-19 MED ORDER — LIDOCAINE HCL (CARDIAC) 20 MG/ML IV SOLN
INTRAVENOUS | Status: DC | PRN
  Administered 2012-06-19: 80 mg via INTRAVENOUS

## 2012-06-19 MED ORDER — PHENOL 1.4 % MT LIQD: 1.0000 | OROMUCOSAL | Status: DC | PRN

## 2012-06-19 MED ORDER — ONDANSETRON HCL 4 MG/2ML IJ SOLN: 4.0000 mg | Freq: Four times a day (QID) | INTRAMUSCULAR | Status: DC | PRN

## 2012-06-19 MED ORDER — ONDANSETRON HCL 4 MG/2ML IJ SOLN
INTRAMUSCULAR | Status: DC | PRN
  Administered 2012-06-19: 4 mg via INTRAVENOUS

## 2012-06-19 MED ORDER — PHENYLEPHRINE HCL 10 MG/ML IJ SOLN
INTRAMUSCULAR | Status: DC | PRN
  Administered 2012-06-19 (×2): 80 ug via INTRAVENOUS

## 2012-06-19 MED ORDER — SENNA 8.6 MG PO TABS
1.0000 | ORAL_TABLET | Freq: Two times a day (BID) | ORAL | Status: DC
  Administered 2012-06-19 – 2012-06-23 (×8): 8.6 mg via ORAL
  Filled 2012-06-19 (×9): qty 1

## 2012-06-19 MED ORDER — VANCOMYCIN HCL 1000 MG IV SOLR
750.0000 mg | Freq: Two times a day (BID) | INTRAVENOUS | Status: DC
  Administered 2012-06-20 – 2012-06-21 (×4): 750 mg via INTRAVENOUS
  Filled 2012-06-19 (×4): qty 750

## 2012-06-19 MED ORDER — LIDOCAINE HCL 4 % MT SOLN
OROMUCOSAL | Status: DC | PRN
  Administered 2012-06-19: 4 mL via TOPICAL

## 2012-06-19 MED ORDER — POLYETHYLENE GLYCOL 3350 17 G PO PACK
17.0000 g | PACK | Freq: Every day | ORAL | Status: DC | PRN
  Administered 2012-06-21: 17 g via ORAL
  Filled 2012-06-19: qty 1

## 2012-06-19 MED ORDER — BUPIVACAINE HCL (PF) 0.25 % IJ SOLN: INTRAMUSCULAR | Status: DC | PRN

## 2012-06-19 MED ORDER — CLONAZEPAM 0.5 MG PO TABS
0.5000 mg | ORAL_TABLET | Freq: Every day | ORAL | Status: DC
  Administered 2012-06-19: 0.5 mg via ORAL
  Filled 2012-06-19: qty 1

## 2012-06-19 MED ORDER — SODIUM CHLORIDE 0.9 % IJ SOLN: 3.0000 mL | INTRAMUSCULAR | Status: DC | PRN

## 2012-06-19 MED ORDER — 0.9 % SODIUM CHLORIDE (POUR BTL) OPTIME
TOPICAL | Status: DC | PRN
  Administered 2012-06-19: 1000 mL

## 2012-06-19 MED ORDER — FENTANYL CITRATE 0.05 MG/ML IJ SOLN
INTRAMUSCULAR | Status: DC | PRN
  Administered 2012-06-19: 100 ug via INTRAVENOUS

## 2012-06-19 MED ORDER — DEXTROSE 5 % IV SOLN
2.0000 g | INTRAVENOUS | Status: DC | PRN
  Administered 2012-06-19: 2 g via INTRAVENOUS

## 2012-06-19 MED ORDER — HEMOSTATIC AGENTS (NO CHARGE) OPTIME
TOPICAL | Status: DC | PRN
  Administered 2012-06-19: 1 via TOPICAL

## 2012-06-19 MED ORDER — GLYCOPYRROLATE 0.2 MG/ML IJ SOLN
INTRAMUSCULAR | Status: DC | PRN
  Administered 2012-06-19: 0.2 mg via INTRAVENOUS

## 2012-06-19 MED ORDER — ALUM & MAG HYDROXIDE-SIMETH 200-200-20 MG/5ML PO SUSP: 30.0000 mL | Freq: Four times a day (QID) | ORAL | Status: DC | PRN

## 2012-06-19 MED ORDER — HYDROMORPHONE HCL PF 1 MG/ML IJ SOLN
0.5000 mg | INTRAMUSCULAR | Status: DC | PRN
  Administered 2012-06-19 (×4): 1 mg via INTRAVENOUS
  Filled 2012-06-19 (×5): qty 1

## 2012-06-19 MED ORDER — LISINOPRIL 10 MG PO TABS
10.0000 mg | ORAL_TABLET | Freq: Every day | ORAL | Status: DC
  Administered 2012-06-19: 10 mg via ORAL
  Filled 2012-06-19: qty 1

## 2012-06-19 MED ORDER — LACTATED RINGERS IV SOLN
INTRAVENOUS | Status: DC | PRN
  Administered 2012-06-19: 13:00:00 via INTRAVENOUS

## 2012-06-19 MED ORDER — SODIUM CHLORIDE 0.9 % IR SOLN
Status: DC | PRN
  Administered 2012-06-19: 13:00:00

## 2012-06-19 MED ORDER — DEXTROSE 5 % IV SOLN
2.0000 g | Freq: Two times a day (BID) | INTRAVENOUS | Status: DC
  Administered 2012-06-19 – 2012-06-23 (×8): 2 g via INTRAVENOUS
  Filled 2012-06-19 (×10): qty 2

## 2012-06-19 SURGICAL SUPPLY — 45 items
BAG DECANTER FOR FLEXI CONT (MISCELLANEOUS) ×2 IMPLANT
BENZOIN TINCTURE PRP APPL 2/3 (GAUZE/BANDAGES/DRESSINGS) IMPLANT
BLADE SURG ROTATE 9660 (MISCELLANEOUS) IMPLANT
BRUSH SCRUB EZ PLAIN DRY (MISCELLANEOUS) ×2 IMPLANT
CANISTER SUCTION 2500CC (MISCELLANEOUS) ×2 IMPLANT
CLOTH BEACON ORANGE TIMEOUT ST (SAFETY) ×2 IMPLANT
CONT SPEC 4OZ CLIKSEAL STRL BL (MISCELLANEOUS) ×2 IMPLANT
DERMABOND ADVANCED (GAUZE/BANDAGES/DRESSINGS)
DERMABOND ADVANCED .7 DNX12 (GAUZE/BANDAGES/DRESSINGS) IMPLANT
DRAPE LAPAROTOMY 100X72X124 (DRAPES) ×2 IMPLANT
DRAPE POUCH INSTRU U-SHP 10X18 (DRAPES) ×2 IMPLANT
DRAPE SURG 17X23 STRL (DRAPES) IMPLANT
ELECT REM PT RETURN 9FT ADLT (ELECTROSURGICAL) ×2
ELECTRODE REM PT RTRN 9FT ADLT (ELECTROSURGICAL) ×1 IMPLANT
EVACUATOR 1/8 PVC DRAIN (DRAIN) ×2 IMPLANT
GAUZE SPONGE 4X4 16PLY XRAY LF (GAUZE/BANDAGES/DRESSINGS) IMPLANT
GLOVE ECLIPSE 8.5 STRL (GLOVE) ×2 IMPLANT
GLOVE EXAM NITRILE LRG STRL (GLOVE) IMPLANT
GLOVE EXAM NITRILE MD LF STRL (GLOVE) ×2 IMPLANT
GLOVE EXAM NITRILE XL STR (GLOVE) IMPLANT
GLOVE EXAM NITRILE XS STR PU (GLOVE) IMPLANT
GLOVE INDICATOR 7.0 STRL GRN (GLOVE) ×2 IMPLANT
GLOVE SURG SS PI 7.0 STRL IVOR (GLOVE) ×4 IMPLANT
GOWN BRE IMP SLV AUR LG STRL (GOWN DISPOSABLE) IMPLANT
GOWN BRE IMP SLV AUR XL STRL (GOWN DISPOSABLE) ×4 IMPLANT
GOWN STRL REIN 2XL LVL4 (GOWN DISPOSABLE) IMPLANT
KIT BASIN OR (CUSTOM PROCEDURE TRAY) ×2 IMPLANT
KIT ROOM TURNOVER OR (KITS) ×2 IMPLANT
NEEDLE HYPO 25X1 1.5 SAFETY (NEEDLE) ×2 IMPLANT
NS IRRIG 1000ML POUR BTL (IV SOLUTION) ×2 IMPLANT
PACK LAMINECTOMY NEURO (CUSTOM PROCEDURE TRAY) ×2 IMPLANT
SPONGE GAUZE 4X4 12PLY (GAUZE/BANDAGES/DRESSINGS) ×2 IMPLANT
SPONGE SURGIFOAM ABS GEL SZ50 (HEMOSTASIS) ×2 IMPLANT
STRIP CLOSURE SKIN 1/2X4 (GAUZE/BANDAGES/DRESSINGS) IMPLANT
SUT ETHILON 2 0 FS 18 (SUTURE) ×4 IMPLANT
SUT VIC AB 0 CT1 18XCR BRD8 (SUTURE) ×1 IMPLANT
SUT VIC AB 0 CT1 8-18 (SUTURE) ×1
SUT VIC AB 2-0 CT1 18 (SUTURE) ×2 IMPLANT
SWAB CULTURE LIQ STUART DBL (MISCELLANEOUS) ×2 IMPLANT
SYR 20ML ECCENTRIC (SYRINGE) ×2 IMPLANT
TAPE CLOTH SURG 4X10 WHT LF (GAUZE/BANDAGES/DRESSINGS) ×2 IMPLANT
TOWEL OR 17X24 6PK STRL BLUE (TOWEL DISPOSABLE) ×2 IMPLANT
TOWEL OR 17X26 10 PK STRL BLUE (TOWEL DISPOSABLE) ×2 IMPLANT
TUBE ANAEROBIC SPECIMEN COL (MISCELLANEOUS) IMPLANT
WATER STERILE IRR 1000ML POUR (IV SOLUTION) ×2 IMPLANT

## 2012-06-19 NOTE — Transfer of Care (Signed)
Immediate Anesthesia Transfer of Care Note  Patient: Arthur Wilcox  Procedure(s) Performed: Procedure(s) (LRB): LUMBAR WOUND DEBRIDEMENT (N/A)  Patient Location: PACU  Anesthesia Type: General  Level of Consciousness: awake, alert  and oriented  Airway & Oxygen Therapy: Patient Spontanous Breathing and Patient connected to nasal cannula oxygen  Post-op Assessment: Report given to PACU RN, Post -op Vital signs reviewed and stable and Patient moving all extremities X 4  Post vital signs: Reviewed and stable  Complications: No apparent anesthesia complications

## 2012-06-19 NOTE — Progress Notes (Signed)
Pt transferred from [facility] via [facility] to room 4N13. Alert and conversant.  Dr Wynetta Emery paged and notified. Awaiting admission orders.

## 2012-06-19 NOTE — Preoperative (Signed)
Beta Blockers   Reason not to administer Beta Blockers:Not Applicable 

## 2012-06-19 NOTE — Progress Notes (Signed)
OT Cancellation Note  Treatment cancelled today due to medical issues with patient which prohibited therapy.  Pt for return to surgery.  Will sign off at this time, and please re-order as indicated post operatively.   Jeani Hawking [phone] 06/19/2012, 12:11 PM

## 2012-06-19 NOTE — Anesthesia Procedure Notes (Signed)
Procedure Name: Intubation Date/Time: 06/19/2012 1:31 PM Performed by: Marena Chancy Pre-anesthesia Checklist: Patient identified, Timeout performed, Emergency Drugs available, Suction available and Patient being monitored Patient Re-evaluated:Patient Re-evaluated prior to inductionOxygen Delivery Method: Circle system utilized Preoxygenation: Pre-oxygenation with 100% oxygen Intubation Type: IV induction Ventilation: Mask ventilation without difficulty Laryngoscope Size: Miller and 2 Grade View: Grade I Tube type: Oral Tube size: 7.5 mm Number of attempts: 1 Placement Confirmation: ETT inserted through vocal cords under direct vision,  breath sounds checked- equal and bilateral and positive ETCO2 Secured at: 23 cm Tube secured with: Tape Dental Injury: Teeth and Oropharynx as per pre-operative assessment

## 2012-06-19 NOTE — Anesthesia Postprocedure Evaluation (Signed)
Anesthesia Post Note  Patient: Arthur Wilcox  Procedure(s) Performed: Procedure(s) (LRB): LUMBAR WOUND DEBRIDEMENT (N/A)  Anesthesia type: General  Patient location: PACU  Post pain: Pain level controlled and Adequate analgesia  Post assessment: Post-op Vital signs reviewed, Patient's Cardiovascular Status Stable, Respiratory Function Stable, Patent Airway and Pain level controlled  Last Vitals:  Filed Vitals:   06/19/12 1520  BP:   Pulse:   Temp: 36.7 C  Resp:     Post vital signs: Reviewed and stable  Level of consciousness: awake, alert  and oriented  Complications: No apparent anesthesia complications

## 2012-06-19 NOTE — Progress Notes (Signed)
PT Cancellation Note  Evaluation cancelled today due to: noted MRI results and anticipated surgery today. Please reorder PT (if indicated) after surgery.Marland Kitchen  Arthur Wilcox 06/19/2012, 8:57 AM Pager [phone]

## 2012-06-19 NOTE — Progress Notes (Signed)
ANTIBIOTIC CONSULT NOTE - INITIAL  Pharmacy Consult for Vancomycin Indication: Post op debridement of lumbar wound infection  No Known Allergies  Patient Measurements: Height: 5\' 8"  (172.7 cm) Weight: 145 lb 11.6 oz (66.1 kg) IBW/kg (Calculated) : 68.4  Adjusted Body Weight:   Vital Signs: Temp: 98.9 F (37.2 C) (07/15 1638) Temp src: Oral (07/15 1638) BP: 95/52 mmHg (07/15 1638) Pulse Rate: 75  (07/15 1638) Intake/Output from previous day: 07/14 0701 - 07/15 0700 In: -  Out: 575 [Urine:575] Intake/Output from this shift: Total I/O In: 800 [I.V.:800] Out: 230 [Urine:230]  Labs:  [facility] 06/16/12 1755  WBC 14.2*  HGB 13.4  PLT 356  LABCREA --  CREATININE 1.07   Estimated Creatinine Clearance: 70.4 ml/min (by C-G formula based on Cr of 1.07). No results found for this basename: VANCOTROUGH:2,VANCOPEAK:2,VANCORANDOM:2,GENTTROUGH:2,GENTPEAK:2,GENTRANDOM:2,TOBRATROUGH:2,TOBRAPEAK:2,TOBRARND:2,AMIKACINPEAK:2,AMIKACINTROU:2,AMIKACIN:2, in the last 72 hours   Microbiology: Recent Results (from the past 720 hour(s))  SURGICAL PCR SCREEN     Status: Normal   Collection Time   05/26/12  1:31 PM      Component Value Range Status Comment   MRSA, PCR NEGATIVE  NEGATIVE Final    Staphylococcus aureus NEGATIVE  NEGATIVE Final     Medical History: Past Medical History  Diagnosis Date  . Anxiety   . Osteoarthritis   . History of foot fracture   . Chronic back pain   . Essential hypertension, benign     takes Lisinopril daily but forgets on occasion  . Headache   . Hyperlipidemia     hx of;has been off of Lipitor 6-78yrs   . Shortness of breath     with exertion  . Migraine     last one a week ago  . Seizures     has been off of Dilantin 17yr;last seizure 45yrs ago  . Joint disease   . Chronic back pain     pinced nerve  . Chronic neck pain     pinched nerve  . Depression     doesn't require meds    Medications:  Scheduled:    . cefTRIAXone (ROCEPHIN)  IV  2  g Intravenous Q12H  . gadobenate dimeglumine  14 mL Intravenous Once  . ketorolac  30 mg Intravenous Q6H  . senna  1 tablet Oral BID  . sodium chloride  3 mL Intravenous Q12H  . vancomycin  750 mg Intravenous Q12H  . DISCONTD: cefTRIAXone (ROCEPHIN)  IV  2 g Intravenous To NeurOR  . DISCONTD: clonazePAM  0.5 mg Oral Daily  . DISCONTD: docusate sodium  100 mg Oral Q12H  . DISCONTD: lisinopril  10 mg Oral Daily  . DISCONTD: polyethylene glycol  17 g Oral Daily  . DISCONTD: polyethylene glycol powder  17 g Oral Daily  . DISCONTD: sodium chloride  3 mL Intravenous Q12H   Assessment: 61 yr old male admitted for debridement of lumbar wound infection following bilateral laminectomy on June 24.  Wt is 66 kg and CrCl is 70 ml/min. Pt has drain. Pt received vancomycin 1 Gm in OR.  Goal of Therapy:  Vancomycin trough level 15-20 mcg/ml  Plan:  1) Vancomycin 750 mg IV q12h 2) Vancomycin trough levels when appropriate.  Eugene Garnet 06/19/2012,4:45 PM

## 2012-06-19 NOTE — Clinical Social Work Note (Signed)
CSW received consult for SNF placement. Pt had durgery today, 06/19/2012. Awaiting PT evals. CSW will continue to follow. Please call with any urgent needs.   Dede Query, MSW, Theresia Majors [phone]

## 2012-06-19 NOTE — Brief Op Note (Signed)
06/19/2012  2:46 PM  PATIENT:  [name]  61 y.o. male  PRE-OPERATIVE DIAGNOSIS:  Lumbar wound infection  POST-OPERATIVE DIAGNOSIS:  Lumbar Wound Infection  PROCEDURE:  Procedure(s) (LRB): LUMBAR WOUND DEBRIDEMENT (N/A)  SURGEON:  Surgeon(s) and Role:    * Temple Pacini, MD - Primary  PHYSICIAN ASSISTANT:   ASSISTANTS:    ANESTHESIA:   general  EBL:     BLOOD ADMINISTERED:none  DRAINS: (Medium) Hemovact drain(s) in the Epidural space with  Suction Open   LOCAL MEDICATIONS USED:  NONE  SPECIMEN:  No Specimen  DISPOSITION OF SPECIMEN:  N/A  COUNTS:  YES  TOURNIQUET:  * No tourniquets in log *  DICTATION: .Dragon Dictation  PLAN OF CARE: Admit to inpatient   PATIENT DISPOSITION:  PACU - hemodynamically stable.   Delay start of Pharmacological VTE agent (>24hrs) due to surgical blood loss or risk of bleeding: yes

## 2012-06-19 NOTE — Anesthesia Preprocedure Evaluation (Signed)
Anesthesia Evaluation  Patient identified by MRN, date of birth, ID band Patient awake    Reviewed: Allergy & Precautions, H&P , NPO status , Patient's Chart, lab work & pertinent test results  Airway       Dental   Pulmonary shortness of breath, Current Smoker,          Cardiovascular hypertension,     Neuro/Psych  Headaches, Seizures -,  Anxiety Depression    GI/Hepatic   Endo/Other    Renal/GU      Musculoskeletal  (+) Arthritis -,   Abdominal   Peds  Hematology   Anesthesia Other Findings   Reproductive/Obstetrics                           Anesthesia Physical Anesthesia Plan  ASA: III  Anesthesia Plan: General   Post-op Pain Management:    Induction: Intravenous  Airway Management Planned: Oral ETT  Additional Equipment:   Intra-op Plan:   Post-operative Plan: Extubation in OR  Informed Consent: I have reviewed the patients History and Physical, chart, labs and discussed the procedure including the risks, benefits and alternatives for the proposed anesthesia with the patient or authorized representative who has indicated his/her understanding and acceptance.     Plan Discussed with: CRNA and Surgeon  Anesthesia Plan Comments:         Anesthesia Quick Evaluation

## 2012-06-19 NOTE — Progress Notes (Signed)
Patient readmitted with increasing back pain and right lower extremity symptoms. Patient with low-grade fevers and significant leukocytosis. MRI scan worrisome for postoperative deep wound space infection with epidural abscess.  Patient is currently awake and alert. He appears mildly toxic. He has pain with straight leg raising right greater than left. His motor examination is intact. Sensory examination reveals decreased sensation over light touch in his right L5 dermatome. Wound has scant at serous drainage. The wound is tender and separated slightly at the surface.  Probable deep wound space infection with secondary epidural abscess. Plan for reexploration of laminotomy with irrigation and debridement of lumbar wound. Cultures will be obtained at that time interbody started after surgery. Patient is aware the risks and benefits involved with surgery and wishes to proceed.

## 2012-06-19 NOTE — Op Note (Signed)
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  Surgeon:Beverlie Kurihara A.Dalan Cowger, M.D.  Asst. Surgeon: None  Anesthesia: General  Indication: 61 year old 3 weeks status post bilateral L3-4 and L4-5 decompressive laminotomies with foraminotomies with initial very good results who presents now with a 2 day history of worsening back pain with radiation to his lower chamois. Patient will a running low-grade fevers. He has a leukocytosis and is becoming mildly toxic. On exam the patient has pain with straight leg raising bilaterally and has some early sensory loss but no motor weakness. MRI scan consistent with deep wound space infection with epidural abscess. Patient presents now for I and D. of his lumbar wound.  Operative note: After induction anesthesia patient positioned prone onto the Wilson frame and her purply padded. Patient's lumbar region prepped and draped sterilely. Wound reopened using a 10 blade. Purulent material under pressure was encountered a CT scan was opened. Cultures were taken. The fascia was opened. Laminotomies were debridement. Deep self-retaining traction placed all elements of epidural fluid and tissue were removed skeletal a skeletonizing the underlying dura and nerve roots. There is no evidence of any CSF leak. There is noted the ventral epidural abscess. Wound is copious irrigated out like solution. The walls were grossly debrided medium Hemovac drain was left bilaterally. Wounds and closed with a Vicryl suture in the lumbodorsal fashion and the skin was closed with interrupted nylon sutures. There were no complications. Patient had  antibiotics started after culture completion.

## 2012-06-19 NOTE — H&P (Signed)
Arthur Wilcox is an 61 y.o. male.   Chief Complaint: Back pain right leg numbness and wound drainage HPI: Patient is a 61 year old gentleman who underwent laminectomy stenosis back on June 24 the last 24 hours she's noticed worsening low back pain with swelling as well as some drainage a characterized as a pink tinge to it is without clear ring with a red border. He says he has felt warm but he has measures temperature denies any known fever. Headaches but they've and do not change with posture. He did have to rest or can grab his granddaughter in the fall. He was having lowered down the pain and swelling in his right groin he underwent a CT scan for last week to rule out hernia but he denies any bowel or bladder trouble. Patient went to [facility] center was evaluated by physician Arthur Wilcox and has subsequently transferred here for evaluation.  Past Medical History  Diagnosis Date  . Anxiety   . Osteoarthritis   . History of foot fracture   . Chronic back pain   . Essential hypertension, benign     takes Lisinopril daily but forgets on occasion  . Headache   . Hyperlipidemia     hx of;has been off of Lipitor 6-32yrs   . Shortness of breath     with exertion  . Migraine     last one a week ago  . Seizures     has been off of Dilantin 55yr;last seizure 31yrs ago  . Joint disease   . Chronic back pain     pinced nerve  . Chronic neck pain     pinched nerve  . Depression     doesn't require meds    Past Surgical History  Procedure Date  . Posterior cervical laminectomy 04/18/2012    Procedure: POSTERIOR CERVICAL LAMINECTOMY;  Surgeon: Temple Pacini, MD;  Location: [facility];  Service: Neurosurgery;  Laterality: Right;  Right Cervical Seven-thoracic One Lamicectomy/Foraminotomy/Microdiscectomy  . Lumbar laminectomy/decompression microdiscectomy 05/29/2012    Procedure: LUMBAR LAMINECTOMY/DECOMPRESSION MICRODISCECTOMY 2 LEVELS;  Surgeon: Temple Pacini, MD;  Location: [facility];   Service: Neurosurgery;  Laterality: Bilateral;  Bilateral Lumbar three through five decompressive lumbar laminectomy     Family History  Problem Relation Age of Onset  . Hypertension Mother   . Stroke Mother   . Diabetes type II Brother   . Coronary artery disease Brother   . Diabetes type II Sister   . Anesthesia problems Neg Hx   . Hypotension Neg Hx   . Malignant hyperthermia Neg Hx   . Pseudochol deficiency Neg Hx    Social History:  reports that he has been smoking Cigarettes and Cigars.  He has a 22 pack-year smoking history. He has never used smokeless tobacco. He reports that he drinks about .6 ounces of alcohol per week. He reports that he uses illicit drugs (Marijuana).  Allergies: No Known Allergies  Medications Prior to Admission  Medication Sig Dispense Refill  . clonazePAM (KLONOPIN) 0.5 MG tablet Take 0.5 mg by mouth daily.      . cyclobenzaprine (FLEXERIL) 10 MG tablet Take 10 mg by mouth 3 (three) times daily as needed. For pain      . docusate sodium (COLACE) 100 MG capsule Take 1 capsule (100 mg total) by mouth every 12 (twelve) hours.  60 capsule  0  . lisinopril (PRINIVIL,ZESTRIL) 10 MG tablet Take 10 mg by mouth daily.      . naproxen  sodium (ANAPROX) 220 MG tablet Take 220 mg by mouth 2 (two) times daily as needed. For pain      . polyethylene glycol powder (GLYCOLAX/MIRALAX) powder Take 17 g by mouth daily.  255 g  0  . sulfamethoxazole-trimethoprim (BACTRIM DS) 800-160 MG per tablet Take 1 tablet by mouth 2 (two) times daily.  28 tablet  0    No results found for this or any previous visit (from the past 48 hour(s)). No results found.  Review of Systems  Constitutional: Positive for fever and malaise/fatigue.  Eyes: Negative.   Respiratory: Negative.   Cardiovascular: Negative.   Gastrointestinal: Positive for abdominal pain.  Genitourinary: Negative.   Musculoskeletal: Positive for myalgias and back pain.  Neurological: Positive for tingling,  sensory change and headaches.    Blood pressure 120/59, pulse 87, temperature 99.1 F (37.3 C), temperature source Oral, resp. rate 18, height 5\' 8"  (1.727 m), weight 66.1 kg (145 lb 11.6 oz), SpO2 97.00%. Physical Exam  Constitutional: He is oriented to person, place, and time. He appears well-developed.  HENT:  Head: Normocephalic.  Eyes: Pupils are equal, round, and reactive to light.  Respiratory: Effort normal and breath sounds normal.  GI: Soft.  Neurological: He is alert and oriented to person, place, and time. He has normal strength. He displays a negative Romberg sign. GCS eye subscore is 4. GCS verbal subscore is 5. GCS motor subscore is 6.  Reflex Scores:      Patellar reflexes are 0 on the right side and 0 on the left side.      Achilles reflexes are 0 on the right side and 0 on the left side.      Strength is 5 of 5 in his lower 70s in his iliopsoas, hamstrings, quadriceps, gastrocs, anterior tibialis, EHL. There is swelling around his incision with some induration and mild amount of erythema I see no active drainage at this point     Assessment/Plan 61 year old gentleman with his approximately 3 weeks status post a decompressive laminectomy for stenosis who presents with a low-grade fever wound swelling and drainage. At the time of surgery there was an inadvertent durotomy this could represent spinal fluid although sitting it very suspicious for a wound infection or combination of both. I will be admitting the patient keep him n.p.o. obtain an MRI scan without contrast later this morning and planning on potentially doing I&D myself or Dr. Jordan Likes later on today. He apparently did have blood work at home and revealing a normal white count I will also order a sedimentation rate C-reactive protein.  Tamiyah Moulin P 06/19/2012, 1:42 AM

## 2012-06-20 ENCOUNTER — Encounter (HOSPITAL_COMMUNITY): Payer: Self-pay | Admitting: Neurosurgery

## 2012-06-20 DIAGNOSIS — T8149XA Infection following a procedure, other surgical site, initial encounter: Secondary | ICD-10-CM | POA: Diagnosis present

## 2012-06-20 LAB — CBC
MCH: 29.6 pg (ref 26.0–34.0)
MCHC: 33.2 g/dL (ref 30.0–36.0)
MCV: 89.1 fL (ref 78.0–100.0)
Platelets: 332 10*3/uL (ref 150–400)
RBC: 3.68 MIL/uL — ABNORMAL LOW (ref 4.22–5.81)

## 2012-06-20 LAB — BASIC METABOLIC PANEL
CO2: 29 mEq/L (ref 19–32)
Calcium: 9.2 mg/dL (ref 8.4–10.5)
Creatinine, Ser: 0.95 mg/dL (ref 0.50–1.35)
GFR calc non Af Amer: >90 mL/min (ref >90)

## 2012-06-20 MED ORDER — SODIUM CHLORIDE 0.9 % IJ SOLN
10.0000 mL | INTRAMUSCULAR | Status: DC | PRN
  Administered 2012-06-20 – 2012-06-23 (×2): 10 mL

## 2012-06-20 NOTE — Care Management Note (Signed)
    Page 1 of 1   06/20/2012     10:52:48 AM   CARE MANAGEMENT NOTE 06/20/2012  Patient:  Arthur Wilcox,Arthur Wilcox   Account Number:  [id number]  Date Initiated:  06/20/2012  Documentation initiated by:  Onnie Boer  Subjective/Objective Assessment:   PT WAS ADMITTED FOR BACK SURGERY     Action/Plan:   PROGRESSION OF CARE AND DISCHARGE PLANNING   Anticipated DC Date:  06/23/2012   Anticipated DC Plan:  SKILLED NURSING FACILITY  In-house referral  Clinical Social Worker      DC Planning Services  CM consult      Choice offered to / List presented to:             Status of service:  In process, will continue to follow Medicare Important Message given?   (If response is "NO", the following Medicare IM given date fields will be blank) Date Medicare IM given:   Date Additional Medicare IM given:    Discharge Disposition:    Per UR Regulation:  Reviewed for med. necessity/level of care/duration of stay  If discussed at Long Length of Stay Meetings, dates discussed:    Comments:  06/20/12 Onnie Boer, RN, BSN 1051 PT WAS ADMITTED FOR BACK PAIN.  PT WILL NEED SNF AT DC. CSW INVOLVED, AWAITING PT/OT EVAL RECOMMENDATIONS.

## 2012-06-20 NOTE — Progress Notes (Signed)
Patient Arthur Wilcox, 61 year old [place] American male is struggling with recover from surgery.  Patient enjoys the emotional support of his family in [place], [place].  He expressed appreciation for Chaplain's provision of pastoral presence, prayer, and conversation.  I will follow-up as needed.

## 2012-06-20 NOTE — Evaluation (Signed)
Occupational Therapy Evaluation Patient Details Name: Arthur Wilcox MRN: [id number] DOB: 09/06/1951 Today's Date: 06/20/2012 Time: 4098-1191 OT Time Calculation (min): 19 min  OT Assessment / Plan / Recommendation Clinical Impression  Pt s/p I&D of lumbar wound (pt had back sx June 24). Pt presents with pain/soreness and generalized weakness impacting PLOF. Pt will benefit from skilled OT in the acute setting to maximize I with ADL and ADL mobility prior to d/c    OT Assessment  Patient needs continued OT Services    Follow Up Recommendations  No OT follow up;Supervision/Assistance - 24 hour (pt may progress to only needing intermittent supervision/A)    Barriers to Discharge Decreased caregiver support sister is unable to provide 24/7 assist  Equipment Recommendations  None recommended by OT    Recommendations for Other Services    Frequency  Min 2X/week    Precautions / Restrictions Precautions Precautions: Back Precaution Comments: pt able to verbalize and generalize 3/3 back precautions Restrictions Weight Bearing Restrictions: No   Pertinent Vitals/Pain Pt reports 9-10/10 pain; pre-medicated and RN informed.    ADL  Eating/Feeding: Simulated;Independent Where Assessed - Eating/Feeding: Chair Grooming: Simulated;Minimal assistance Where Assessed - Grooming: Supported standing Lower Body Bathing: Simulated;Minimal assistance Where Assessed - Lower Body Bathing: Supported standing Upper Body Dressing: Performed;Minimal assistance Where Assessed - Upper Body Dressing: Unsupported sitting Lower Body Dressing: Performed;Set up;Supervision/safety Where Assessed - Lower Body Dressing: Supine, head of bed up;Supported sit to stand Toilet Transfer: Simulated;Min guard Toilet Transfer Method: Sit to Barista:  (from bed) Toileting - Clothing Manipulation and Hygiene: Simulated;Moderate assistance Where Assessed - Toileting Clothing Manipulation and  Hygiene: Standing Transfers/Ambulation Related to ADLs: Pt ambulated throughout room with Min HHA but c/o of inc right hip pain that would sometimes cause pt to have to stop to keep balance. Pt may benefit from RW while in hospital  ADL Comments: Anticipate good progress Will benefit from tub/shower transfer practice; dynamic standing (grooming) and LB dsg (esp sit to stand).    OT Diagnosis: Generalized weakness;Acute pain  OT Problem List: Decreased strength;Decreased activity tolerance;Impaired balance (sitting and/or standing);Decreased knowledge of use of DME or AE;Pain OT Treatment Interventions: Self-care/ADL training;Therapeutic activities;Patient/family education;Balance training;DME and/or AE instruction   OT Goals Acute Rehab OT Goals OT Goal Formulation: With patient Time For Goal Achievement: 06/27/12 Potential to Achieve Goals: Good ADL Goals Pt Will Perform Grooming: Independently;Standing at sink ADL Goal: Grooming - Progress: Goal set today Pt Will Perform Lower Body Dressing: with modified independence;Sit to stand from chair;Sit to stand from bed;Supine, head of bed up ADL Goal: Lower Body Dressing - Progress: Goal set today Pt Will Transfer to Toilet: with modified independence;with DME;Ambulation ADL Goal: Toilet Transfer - Progress: Goal set today Pt Will Perform Toileting - Hygiene: with modified independence;Sit to stand from 3-in-1/toilet;Sitting on 3-in-1 or toilet ADL Goal: Toileting - Hygiene - Progress: Goal set today Additional ADL Goal #1: Pt will be Mod I with all ADL item retrieval. ADL Goal: Additional Goal #1 - Progress: Goal set today  Visit Information  Last OT Received On: 06/20/12 Assistance Needed: +1    Subjective Data  Subjective: I didn't realize I was going to hurt this bad Patient Stated Goal: Return home with sister   Prior Functioning  Vision/Perception  Home Living Lives With: Family (sister) Available Help at Discharge:  Family;Available PRN/intermittently (sister sometimes is gone with her grandchilden ) Type of Home: House Home Access: Stairs to enter Home Layout: One level Bathroom Shower/Tub:  Tub/shower unit;Curtain Firefighter: Standard Prior Function Level of Independence: Independent Able to Take Stairs?: Reciprically Driving: No Vocation: Retired Comments: pt recently with short-stay back sx; was doing well afterward according to pt Communication Communication: No difficulties Dominant Hand: Right      Cognition  Overall Cognitive Status: Appears within functional limits for tasks assessed/performed Orientation Level: Appears intact for tasks assessed Behavior During Session: [facility] for tasks performed    Extremity/Trunk Assessment Right Upper Extremity Assessment RUE ROM/Strength/Tone: Within functional levels RUE Sensation: WFL - Light Touch RUE Coordination: WFL - gross/fine motor Left Upper Extremity Assessment LUE ROM/Strength/Tone: Within functional levels LUE Sensation: WFL - Light Touch LUE Coordination: WFL - gross/fine motor   Mobility Bed Mobility Bed Mobility: Rolling Right;Right Sidelying to Sit;Sitting - Scoot to Edge of Bed Rolling Right: 6: Modified independent (Device/Increase time);With rail Right Sidelying to Sit: 5: Supervision;With rails;HOB elevated Sitting - Scoot to Edge of Bed: 5: Supervision;With rail Details for Bed Mobility Assistance: pt able to demonstrate log rolling without any cues; inc time to come to sitting ?secondary to pain? Transfers Transfers: Sit to Stand;Stand to Sit Sit to Stand: 4: Min guard;From bed;With upper extremity assist Stand to Sit: 4: Min guard;With armrests;To chair/3-in-1 Details for Transfer Assistance: pt with good hand placement and control of descent. min guard A for safety    Exercise    Balance    End of Session OT - End of Session Equipment Utilized During Treatment: Gait belt Activity Tolerance: Patient limited by  pain Patient left: in chair;with call bell/phone within reach Nurse Communication: Mobility status  GO     Ciaira Natividad 06/20/2012, 11:04 AM

## 2012-06-20 NOTE — Progress Notes (Signed)
Peripherally Inserted Central Catheter/Midline Placement  The IV Nurse has discussed with the patient and/or persons authorized to consent for the patient, the purpose of this procedure and the potential benefits and risks involved with this procedure.  The benefits include less needle sticks, lab draws from the catheter and patient may be discharged home with the catheter.  Risks include, but not limited to, infection, bleeding, blood clot (thrombus formation), and puncture of an artery; nerve damage and irregular heat beat.  Alternatives to this procedure were also discussed.  PICC/Midline Placement Documentation  PICC / Midline Single Lumen 06/20/12 PICC Right Basilic (Active)       Stacie Glaze Horton 06/20/2012, 2:01 PM

## 2012-06-20 NOTE — Evaluation (Signed)
I have read and agree with below assessment and plan. Johndavid Geralds Helen Whitlow PT, DPT Pager: [phone] 

## 2012-06-20 NOTE — Op Note (Signed)
This operative note is correct

## 2012-06-20 NOTE — Progress Notes (Signed)
Postop day 1. Patient looks better. Much less encephalopathic. He complains of back pain but no lower extremity pain. He denies any numbness or paresthesias. He's been able to stand and ambulate.  He is afebrile. His vitals are stable. His dressing is dry. Drain output is low. Motor and sensory function of the extremities are normal. Chest and abdomen are benign.  Cultures are pending. Gram stain nondiagnostic.  Continue vancomycin and Rocephin for wound infection. Mobilize as tolerated. Given social situation and level of care required and the patient will require short-term nursing home placement.

## 2012-06-20 NOTE — ED Provider Notes (Signed)
Medical screening examination/treatment/procedure(s) were performed by non-physician practitioner and as supervising physician I was immediately available for consultation/collaboration.   Suzi Roots, MD 06/20/12 [phone]

## 2012-06-20 NOTE — Evaluation (Signed)
Physical Therapy Evaluation Patient Details Name: Arthur Wilcox MRN: [id number] DOB: 20-Jul-1951 Today's Date: 06/20/2012 Time: 4098-1191 PT Time Calculation (min): 28 min  PT Assessment / Plan / Recommendation Clinical Impression  Pt s/p I&D post op day 1. Pt limited by pain and decreased endurance. Pt will benefit from skilled PT to improve mobility and increase endurance. Pt encouraged to ambulate with nursing 1-2 more times today. Discussed with patient need for supervision while walking due to pain and decreased endurance.    PT Assessment  Patient needs continued PT services    Follow Up Recommendations  Supervision - Intermittent;Home health PT    Barriers to Discharge Decreased caregiver support possible decreased caregiver support due to sister being home intermittenly and unreliable brother in law support.    Equipment Recommendations  Rolling walker with 5" wheels    Recommendations for Other Services     Frequency Min 3X/week (+)    Precautions / Restrictions Precautions Precautions: Back Precaution Comments: back precautions discussed due to recent nature of surgery Restrictions Weight Bearing Restrictions: No         Mobility  Bed Mobility Bed Mobility: Rolling Left;Left Sidelying to Sit;Sit to Sidelying Left Rolling Left: 6: Modified independent (Device/Increase time) Left Sidelying to Sit: 5: Supervision (with verbal cues for sequencing) Sit to Sidelying Left: 5: Supervision Details for Bed Mobility Assistance: pt able to log roll, increased time to perform mobility Transfers Transfers: Sit to Stand;Stand to Sit Sit to Stand: 4: Min guard;With upper extremity assist;From bed Stand to Sit: 5: Supervision;With upper extremity assist;To bed Details for Transfer Assistance: min guard and supervision needed for sequencing and safety. Ambulation/Gait Ambulation/Gait Assistance: 5: Supervision;4: Min guard Assistive device: Rolling walker Ambulation/Gait  Assistance Details: occasional min guard required as pt fatigued. occasional verbal cues required for proper positioning in the walker. Gait velocity: slow        PT Diagnosis: Acute pain;Generalized weakness;Difficulty walking;Abnormality of gait  PT Problem List: Decreased strength;Decreased activity tolerance;Decreased mobility PT Treatment Interventions: Gait training;Stair training;Functional mobility training;DME instruction;Therapeutic activities;Patient/family education;Therapeutic exercise   PT Goals Acute Rehab PT Goals PT Goal Formulation: With patient Time For Goal Achievement: 06/27/12 Potential to Achieve Goals: Good Pt will Roll Supine to Right Side: with modified independence PT Goal: Rolling Supine to Right Side - Progress: Goal set today Pt will Roll Supine to Left Side: with modified independence PT Goal: Rolling Supine to Left Side - Progress: Goal set today Pt will go Supine/Side to Sit: with modified independence PT Goal: Supine/Side to Sit - Progress: Goal set today Pt will go Sit to Stand: with modified independence PT Goal: Sit to Stand - Progress: Goal set today Pt will go Stand to Sit: with modified independence PT Goal: Stand to Sit - Progress: Goal set today Pt will Transfer Bed to Chair/Chair to Bed: with modified independence PT Transfer Goal: Bed to Chair/Chair to Bed - Progress: Goal set today Pt will Ambulate: 51 - 150 feet;with least restrictive assistive device PT Goal: Ambulate - Progress: Goal set today Pt will Go Up / Down Stairs: 3-5 stairs;with modified independence;with rail(s) PT Goal: Up/Down Stairs - Progress: Goal set today  Visit Information  Assistance Needed: +1    Subjective Data  Subjective: I have 10/10 pain that doesn't ever sseem to go away.   Prior Functioning  Home Living Lives With: Family Available Help at Discharge: Available PRN/intermittently (sister is sometimes gone with grandchildren or to church) Type of Home:  House Home Access: Stairs to  enter Entrance Stairs-Number of Steps: 3 Home Layout: One level Bathroom Shower/Tub: Network engineer: None Prior Function Driving: No Vocation: Retired Comments: according to pt was doing well post back surgery prior to infection Communication Communication: No difficulties Dominant Hand: Right    Cognition  Overall Cognitive Status: Appears within functional limits for tasks assessed/performed Arousal/Alertness: Awake/alert Orientation Level: Appears intact for tasks assessed Behavior During Session: [facility] for tasks performed Cognition - Other Comments: thought surgery was performed today instead of yesterday    Extremity/Trunk Assessment Right Upper Extremity Assessment RUE ROM/Strength/Tone:  (defer to OT eval) Right Lower Extremity Assessment RLE ROM/Strength/Tone: [facility] for tasks assessed Left Lower Extremity Assessment LLE ROM/Strength/Tone: [facility] for tasks assessed Trunk Assessment Trunk Assessment: Normal   Balance Balance Balance Assessed: No  End of Session PT - End of Session Equipment Utilized During Treatment: Gait belt Activity Tolerance: Patient tolerated treatment well;Patient limited by fatigue Patient left: in bed;with call bell/phone within reach;with nursing in room Nurse Communication: Patient requests pain meds  GP     Elray Dains 06/20/2012, 3:36 PM

## 2012-06-21 LAB — VANCOMYCIN, TROUGH: Vancomycin Tr: 10.9 ug/mL (ref 10.0–20.0)

## 2012-06-21 MED ORDER — VANCOMYCIN HCL IN DEXTROSE 1-5 GM/200ML-% IV SOLN
1000.0000 mg | Freq: Two times a day (BID) | INTRAVENOUS | Status: DC
  Administered 2012-06-22 – 2012-06-23 (×3): 1000 mg via INTRAVENOUS
  Filled 2012-06-21 (×5): qty 200

## 2012-06-21 NOTE — Progress Notes (Signed)
Postop day 2. Overall doing better. Pain well controlled. Minimal lower extremity symptoms.  Afebrile with stable vitals. Motor and sensory exam intact. Wound clean and dry. Drains removed.  Cultures consistent with staph aureus sensitivities pending. Continue current antibiotics. Mobilize. Plan for discharge home with home antibiotic soon

## 2012-06-21 NOTE — Clinical Social Work Psychosocial (Signed)
     Clinical Social Work Department BRIEF PSYCHOSOCIAL ASSESSMENT 06/21/2012  Patient:  Arthur Wilcox,Arthur Wilcox     Account Number:  [id number]     Admit date:  06/19/2012  Clinical Social Worker:  Peggyann Shoals  Date/Time:  06/21/2012 10:35 AM  Referred by:  Physician  Date Referred:  06/19/2012 Referred for  SNF Placement   Other Referral:   Interview type:  Patient Other interview type:    PSYCHOSOCIAL DATA Living Status:  FAMILY Admitted from facility:   Level of care:   Primary support name:  Bethann Berkshire Primary support relationship to patient:  SIBLING Degree of support available:   adequate    CURRENT CONCERNS Current Concerns  Other - See comment   Other Concerns:   Housing concerns    SOCIAL WORK ASSESSMENT / PLAN CSW reviewed chart and spoke with pt's bedside RN regarding consult. Per MD note, pt had a difficult social situation and may require SNF placement. Currently PT is recommending home health PT with intermittent supervision.    CSW met with pt to address consult. CSW introduced herself and explained the role of social work. CSW explained SNF process, in which he declined.    Pt currently lives with his sister and will be staying there until September. Pt shared that this is not a permanent solution and interested in finding a new place to live. Pt reported that he lives in a home that is owned by his sister. Per the pt, the home is in need of repair and pt shared that he is not in the position to fix it, neither is his sister. Pt shared that is lives off of disability check. Pt shared that he is interested in obtaining subsidized housing that would accommodate a disabled occupants.    CSW provided information on the housing authority for further assistance. CSW referred pt to [facility] for further intervention for home health services. CSW is signing off at this time as no further needs identified. Please reconsult if a need arises prior to discharge.    Assessment/plan status:  No Further Intervention Required Other assessment/ plan:   Information/referral to community resources:   [employer] for [place], [place], [place], [place], and [place], [place].    PATIENTS/FAMILYS RESPONSE TO PLAN OF CARE: Pt is alert and oriented. Pt was very pleasant. Pt declined SNF. Pt was grateful for CSW intervention.

## 2012-06-21 NOTE — Progress Notes (Signed)
Occupational Therapy Treatment Patient Details Name: Arthur Wilcox MRN: [id number] DOB: 09-26-1951 Today's Date: 06/21/2012 Time: 6387-5643 OT Time Calculation (min): 23 min  OT Assessment / Plan / Recommendation Comments on Treatment Session Pt making great progress    Follow Up Recommendations  No OT follow up;Supervision - Intermittent    Barriers to Discharge       Equipment Recommendations  Rolling walker with 5" wheels    Recommendations for Other Services    Frequency     Plan Discharge plan remains appropriate    Precautions / Restrictions Precautions Precautions: Back Restrictions Weight Bearing Restrictions: No   Pertinent Vitals/Pain Pt reports 3/10 back pain during session; pt also reported a pain that traveled across rt hip and down anterior thigh which appeared during ambulation (did not rate this, but able to continue with therapy); pt was pre-medicated    ADL  Grooming: Performed;Supervision/safety Where Assessed - Grooming: Unsupported standing Toilet Transfer: Research scientist (life sciences) Method: Sit to Barista: Regular height toilet;Grab bars Toileting - Architect and Hygiene: Simulated;Supervision/safety Where Assessed - Engineer, mining and Hygiene: Standing Tub/Shower Transfer: Engineer, manufacturing Method: Science writer: Transfer tub bench (pt stepped over ledge) Equipment Used: Rolling walker Transfers/Ambulation Related to ADLs: Pt ambulated with supervision transitioning to Mod I with RW >570ft.  ADL Comments: pt reports feeling much steadier with RW    OT Diagnosis:    OT Problem List:   OT Treatment Interventions:     OT Goals Acute Rehab OT Goals Time For Goal Achievement: 06/27/12 ADL Goals Pt Will Perform Grooming: Independently;Standing at sink ADL Goal: Grooming - Progress: Progressing toward goals Pt Will Perform  Lower Body Dressing: with modified independence;Sit to stand from chair;Sit to stand from bed;Supine, head of bed up ADL Goal: Lower Body Dressing - Progress: Progressing toward goals Pt Will Transfer to Toilet: with modified independence;with DME;Ambulation ADL Goal: Toilet Transfer - Progress: Progressing toward goals Pt Will Perform Toileting - Hygiene: with modified independence;Sit to stand from 3-in-1/toilet;Sitting on 3-in-1 or toilet ADL Goal: Toileting - Hygiene - Progress: Progressing toward goals Additional ADL Goal #1: Pt will be Mod I with all ADL item retrieval. ADL Goal: Additional Goal #1 - Progress: Progressing toward goals  Visit Information  Last OT Received On: 06/21/12 Assistance Needed: +1    Subjective Data      Prior Functioning       Cognition  Overall Cognitive Status: Appears within functional limits for tasks assessed/performed Arousal/Alertness: Awake/alert Orientation Level: Appears intact for tasks assessed Behavior During Session: [facility] for tasks performed    Mobility Bed Mobility Rolling Right: 5: Supervision Rolling Left: 5: Supervision Right Sidelying to Sit: 6: Modified independent (Device/Increase time);HOB flat Left Sidelying to Sit: 5: Supervision Sit to Sidelying Left: 5: Supervision Transfers Sit to Stand: 5: Supervision;With upper extremity assist;From bed;From chair/3-in-1 Stand to Sit: 5: Supervision;With upper extremity assist;To chair/3-in-1;To bed Details for Transfer Assistance: Cues for safe hand placement   Exercises    Balance  while in gym- pt ascended and descended stairs with two rails with min guard A  End of Session OT - End of Session Equipment Utilized During Treatment: Gait belt Activity Tolerance: Patient tolerated treatment well Patient left: in chair;with call bell/phone within reach Nurse Communication: Mobility status  GO     Veleka Djordjevic 06/21/2012, 3:28 PM

## 2012-06-21 NOTE — Progress Notes (Signed)
ANTIBIOTIC CONSULT NOTE - INITIAL  Pharmacy Consult for Vancomycin Indication: Post op debridement of lumbar wound infection with epidural abscess  No Known Allergies  Patient Measurements: Height: 5\' 8"  (172.7 cm) Weight: 145 lb 11.6 oz (66.1 kg) IBW/kg (Calculated) : 68.4  Adjusted Body Weight:   Vital Signs: Temp: 98.3 F (36.8 C) (07/17 0924) Temp src: Oral (07/17 0600) BP: 131/69 mmHg (07/17 0924) Pulse Rate: 80  (07/17 0924) Intake/Output from previous day: 07/16 0701 - 07/17 0700 In: 513 [P.O.:360; I.V.:3; IV Piggyback:150] Out: 1030 [Urine:950; Drains:80] Intake/Output from this shift: Total I/O In: 240 [P.O.:240] Out: -   Labs:  Basename 06/20/12 0540  WBC 7.3  HGB 10.9*  PLT 332  LABCREA --  CREATININE 0.95   Estimated Creatinine Clearance: 79.2 ml/min (by C-G formula based on Cr of 0.95).  Basename 06/21/12 1301  VANCOTROUGH 10.9  VANCOPEAK --  VANCORANDOM --  GENTTROUGH --  GENTPEAK --  GENTRANDOM --  TOBRATROUGH --  TOBRAPEAK --  TOBRARND --  AMIKACINPEAK --  AMIKACINTROU --  AMIKACIN --     Microbiology: Recent Results (from the past 720 hour(s))  SURGICAL PCR SCREEN     Status: Normal   Collection Time   05/26/12  1:31 PM      Component Value Range Status Comment   MRSA, PCR NEGATIVE  NEGATIVE Final    Staphylococcus aureus NEGATIVE  NEGATIVE Final   WOUND CULTURE     Status: Normal (Preliminary result)   Collection Time   06/19/12  2:09 PM      Component Value Range Status Comment   Specimen Description WOUND BACK   Final    Special Requests NONE   Final    Gram Stain     Final    Value: MODERATE WBC PRESENT, PREDOMINANTLY PMN     NO SQUAMOUS EPITHELIAL CELLS SEEN     NO ORGANISMS SEEN   Culture     Final    Value: FEW STAPHYLOCOCCUS AUREUS     Note: RIFAMPIN AND GENTAMICIN SHOULD NOT BE USED AS SINGLE DRUGS FOR TREATMENT OF STAPH INFECTIONS.   Report Status PENDING   Incomplete     Medical History: Past Medical History    Diagnosis Date  . Anxiety   . Osteoarthritis   . History of foot fracture   . Chronic back pain   . Essential hypertension, benign     takes Lisinopril daily but forgets on occasion  . Headache   . Hyperlipidemia     hx of;has been off of Lipitor 6-24yrs   . Shortness of breath     with exertion  . Migraine     last one a week ago  . Seizures     has been off of Dilantin 31yr;last seizure 64yrs ago  . Joint disease   . Chronic back pain     pinced nerve  . Chronic neck pain     pinched nerve  . Depression     doesn't require meds    Medications:  Scheduled:     . cefTRIAXone (ROCEPHIN)  IV  2 g Intravenous Q12H  . ketorolac  30 mg Intravenous Q6H  . senna  1 tablet Oral BID  . sodium chloride  3 mL Intravenous Q12H  . vancomycin  750 mg Intravenous Q12H   Assessment: 61 yr old male admitted for debridement of lumbar wound infection following bilateral laminectomy on June 24.  Wt is 66 kg and CrCl is 70 ml/min. Trough came  back as 10.9. I'll shoot for a goal of 15-20 since did have epidural abscess.   Goal of Therapy:  Vancomycin trough level 15-20 mcg/ml  Plan:  1) Change vanc to 1g IV q12

## 2012-06-22 LAB — WOUND CULTURE

## 2012-06-22 NOTE — Progress Notes (Signed)
I have read and agree with d/c from PT services. Ivonne Andrew PT, DPT Pager: [phone]

## 2012-06-22 NOTE — Progress Notes (Signed)
Overall much better. Minimal pain. No lower trimming symptoms.  Afebrile with stable vitals. Motor and sensory exam intact. Wound is currently clean and dry.  Culture sensitivity still pending.  Continue current antibiotics until culture sensitivity confirmed. Plan discharge home with home health antibiotics.

## 2012-06-22 NOTE — Progress Notes (Signed)
Physical Therapy Treatment Patient Details Name: Sierra Bissonette MRN: [id number] DOB: Sep 20, 1951 Today's Date: 06/22/2012 Time: 4098-1191 PT Time Calculation (min): 10 min  PT Assessment / Plan / Recommendation Comments on Treatment Session  Patient progressed well today. No further PT needs. Will signoff    Follow Up Recommendations  No PT follow up    Barriers to Discharge        Equipment Recommendations  Rolling walker with 5" wheels (for longer distance and out in community)    Recommendations for Other Services    Frequency     Plan All goals met and education completed, patient dischaged from PT services    Precautions / Restrictions Precautions Precautions: Back   Pertinent Vitals/Pain     Mobility  Bed Mobility Sit to Supine: 6: Modified independent (Device/Increase time) Sit to Sidelying Left: 6: Modified independent (Device/Increase time) Transfers Sit to Stand: 6: Modified independent (Device/Increase time) Stand to Sit: 6: Modified independent (Device/Increase time) Ambulation/Gait Ambulation/Gait Assistance: 6: Modified independent (Device/Increase time) Ambulation Distance (Feet): 350 Feet Assistive device: None Gait Pattern: Within Functional Limits Stairs: Yes Stairs Assistance: 6: Modified independent (Device/Increase time) Stair Management Technique: One rail Right Number of Stairs: 10     Exercises     PT Diagnosis:    PT Problem List:   PT Treatment Interventions:     PT Goals Acute Rehab PT Goals PT Goal: Rolling Supine to Right Side - Progress: Met PT Goal: Rolling Supine to Left Side - Progress: Met PT Goal: Supine/Side to Sit - Progress: Met PT Goal: Sit to Stand - Progress: Met PT Goal: Stand to Sit - Progress: Met PT Transfer Goal: Bed to Chair/Chair to Bed - Progress: Met PT Goal: Ambulate - Progress: Met PT Goal: Up/Down Stairs - Progress: Met  Visit Information  Last PT Received On: 06/22/12 Assistance Needed: +1    Subjective  Data      Cognition  Overall Cognitive Status: Appears within functional limits for tasks assessed/performed Arousal/Alertness: Awake/alert Orientation Level: Appears intact for tasks assessed Behavior During Session: [facility] for tasks performed    Balance     End of Session PT - End of Session Equipment Utilized During Treatment: Gait belt Activity Tolerance: Patient tolerated treatment well Patient left: in chair Nurse Communication: Mobility status   GP     Fredrich Birks 06/22/2012, 10:15 AM 06/22/2012 Fredrich Birks PTA [phone] pager [phone] office

## 2012-06-23 MED ORDER — DEXTROSE 5 % IV SOLN: 2.0000 g | Freq: Three times a day (TID) | INTRAVENOUS | Status: DC

## 2012-06-23 MED ORDER — HYDROCODONE-ACETAMINOPHEN 5-325 MG PO TABS: 1.0000 | ORAL_TABLET | ORAL | Status: AC | PRN

## 2012-06-23 MED ORDER — HEPARIN SOD (PORK) LOCK FLUSH 100 UNIT/ML IV SOLN
250.0000 [IU] | INTRAVENOUS | Status: AC | PRN
  Administered 2012-06-23: 250 [IU]

## 2012-06-23 NOTE — Discharge Summary (Signed)
Physician Discharge Summary  Patient ID: Arthur Wilcox MRN: [id number] DOB/AGE: Apr 13, 1951 61 y.o.  Admit date: 06/19/2012 Discharge date: 06/23/2012  Admission Diagnoses:  Discharge Diagnoses:  Principal Problem:  *Wound infection after surgery   Discharged Condition: good  Hospital Course: Patient admitted for treatment of a lumbar epidural abscess. Patient taken the operating room where Debridement of his wound and evacuation of abscess. Cultures grew staph aureus which was sensitive to methicillin. Plan for discharge home on antibiotic therapy IV for at least 3 weeks. We will switch his antibiotics to Ancef. Currently the patient has minimal back pain. No lower chamois pain. He is afebrile. No evidence of ongoing infection.  Consults:   Significant Diagnostic Studies:   Treatments:   Discharge Exam: Blood pressure 142/70, pulse 84, temperature 97.5 F (36.4 C), temperature source Oral, resp. rate 18, height 5\' 8"  (1.727 m), weight 66.1 kg (145 lb 11.6 oz), SpO2 100.00%. Awake and alert oriented appropriate her cranial nerve function is intact. Motor and sensory function intact. Wound clean dry and intact. Chest abdomen benign.  Disposition: 01-Home or Self Care   Medication List  As of 06/23/2012 12:51 PM   TAKE these medications         clonazePAM 0.5 MG tablet   Commonly known as: KLONOPIN   Take 0.5 mg by mouth daily.      cyclobenzaprine 10 MG tablet   Commonly known as: FLEXERIL   Take 10 mg by mouth 3 (three) times daily as needed. For pain      dextrose 5 % SOLN 50 mL with ceFAZolin 1 G SOLR 2 g ivpb   Inject 2 g into the vein every 8 (eight) hours.      HYDROcodone-acetaminophen 5-325 MG per tablet   Commonly known as: NORCO/VICODIN   Take 1-2 tablets by mouth every 4 (four) hours as needed.      lisinopril 10 MG tablet   Commonly known as: PRINIVIL,ZESTRIL   Take 10 mg by mouth daily.           Follow-up Information    Follow up with Shreya Lacasse A,  MD. Schedule an appointment as soon as possible for a visit in 2 weeks.   Contact information:   [address] [place] [address] [phone]          Signed: Temple Pacini 06/23/2012, 12:51 PM

## 2012-06-23 NOTE — Progress Notes (Signed)
Patient discharge home with prescriptions, rolling walker and all personal belongings.  Patient discharged home with PICC line and [facility] will be continuing care with patient at home.  Instructions provided to patient, no questions at this time.  Patient expressed concern about pain medication.  Notified MD, no changes to discharged medications.  Patient left unit in wheelchair in stable condition.  Osvaldo Angst, RN

## 2012-06-24 LAB — CULTURE, BLOOD (SINGLE)

## 2012-06-25 LAB — CULTURE, BLOOD (SINGLE)

## 2014-01-17 IMAGING — NM NM MYOCAR SINGLE W/SPECT W/WALL MOTION & EF
1 series · 6 of 6 positions shown · non-contrast
Comparison: none

nm myoview pharmacologic stress

Ordering Physician: DEDE CANAS
Annakaye Physician: [REDACTED]al Data: 60-year-old gentleman with chest discomfort.
NUCLEAR MEDICINE ADENOSINE STRESS MYOVIEW STUDY WITH SPECT AND LEFT
VENTRIUCLAR EJECTION FRACTION
Radionuclide Data: One-day rest/stress protocol performed with
[DATE] mCi of Cc-EEm Myoview.
Stress Data: Regadenoson infusion resulted in dizziness.  There was
a typical increase in heart rate and a modest decrease in systolic
blood pressure following drug administration.  No arrhythmias
noted.
EKG: Sinus bradycardia at a rate of 57, delayed R-wave progression,
otherwise normal.  No significant change following pharmacologic
stress.
Scintigraphic Data: Imaging was performed with the the patient's
arms at his sides.  Intense hepatic activity was noted adjacent to
the inferior myocardium resulting in improper normalization of
myocardial activity.  Left ventricular size was normal.  On
tomographic images reconstructed in standard planes, there appeared
to be a small and mild defect involving the mid and distal
anteroseptal region and extending apically.  Reversibility was
noted visually, but by numeric analysis, the defect was not
numerically significant, and no reversibility was apparent. The
gated reconstruction demonstrated mild global hypokinesis with an
estimated ejection fraction of 43% and normal systolic accentuation
of activity throughout.

[Series 1: cs cardiac tc hi dose · 6.41mm/px · 6 of 512 frames shown]
[frame 43/512]
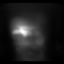
[frame 128/512]
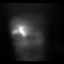
[frame 214/512]
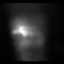
[frame 299/512]
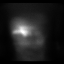
[frame 384/512]
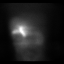
[frame 470/512]
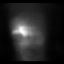

[6 of 6 positions shown; findings below may reference images not displayed]

IMPRESSION: Probably negative pharmacologic stress nuclear myocardial study
revealing no stress induced EKG abnormalities, normal left
ventricular size and mild global reduction in left ventricular
systolic function.  By scintigraphic imaging, data from visual
image analysis conflicted with that obtained from numeric
assessment.  Apparent perfusion abnormalities probably reflect
technical issues concerning image normalization; however, the
possibility of mild ischemia in the distribution of the left
anterior descending coronary artery cannot be unequivocally
excluded.

## 2015-05-21 ENCOUNTER — Encounter: Payer: Self-pay | Admitting: Emergency Medicine

## 2015-05-21 ENCOUNTER — Emergency Department
Admission: EM | Admit: 2015-05-21 | Discharge: 2015-05-21 | Disposition: A | Discharge status: Home / Self Care | Payer: Medicaid Other | Attending: Student | Admitting: Student

## 2015-05-21 DIAGNOSIS — Z720 Tobacco use: Secondary | ICD-10-CM | POA: Diagnosis not present

## 2015-05-21 DIAGNOSIS — Z79899 Other long term (current) drug therapy: Secondary | ICD-10-CM | POA: Insufficient documentation

## 2015-05-21 DIAGNOSIS — L03211 Cellulitis of face: Secondary | ICD-10-CM | POA: Insufficient documentation

## 2015-05-21 DIAGNOSIS — Z792 Long term (current) use of antibiotics: Secondary | ICD-10-CM | POA: Insufficient documentation

## 2015-05-21 DIAGNOSIS — H01001 Unspecified blepharitis right upper eyelid: Secondary | ICD-10-CM | POA: Diagnosis not present

## 2015-05-21 DIAGNOSIS — I10 Essential (primary) hypertension: Secondary | ICD-10-CM | POA: Insufficient documentation

## 2015-05-21 DIAGNOSIS — R220 Localized swelling, mass and lump, head: Secondary | ICD-10-CM | POA: Diagnosis present

## 2015-05-21 MED ORDER — CEPHALEXIN 500 MG PO CAPS
500.0000 mg | ORAL_CAPSULE | Freq: Three times a day (TID) | ORAL | Status: DC
Start: 2015-05-21 — End: 2015-05-22

## 2015-05-21 NOTE — ED Notes (Signed)
Patient states that he had a "bump" on his right eyebrow which he 'busted'. Patient notes redness and heaviness to the right eyelid yesterday however Patient woke this AM and noticed swelling to the eyelid and blurred vision.

## 2015-05-21 NOTE — Discharge Instructions (Signed)
Cellulitis Cellulitis is an infection of the skin and the tissue under the skin. The infected area is usually red and tender. This happens most often in the arms and lower legs. HOME CARE   Take your antibiotic medicine as told. Finish the medicine even if you start to feel better.  Keep the infected arm or leg raised (elevated).  Put a warm cloth on the area up to 4 times per day.  Only take medicines as told by your doctor.  Keep all doctor visits as told. GET HELP IF:  You see red streaks on the skin coming from the infected area.  Your red area gets bigger or turns a dark color.  Your bone or joint under the infected area is painful after the skin heals.  Your infection comes back in the same area or different area.  You have a puffy (swollen) bump in the infected area.  You have new symptoms.  You have a fever. GET HELP RIGHT AWAY IF:   You feel very sleepy.  You throw up (vomit) or have watery poop (diarrhea).  You feel sick and have muscle aches and pains. MAKE SURE YOU:   Understand these instructions.  Will watch your condition.  Will get help right away if you are not doing well or get worse. Document Released: 05/10/2008 Document Revised: 04/08/2014 Document Reviewed: 02/07/2012 [facility] Patient Information 2015 [place], [place]. This information is not intended to replace advice given to you by your health care provider. Make sure you discuss any questions you have with your health care provider.  Take the prescription antibiotic as directed.  Apply cool compresses to reduce swelling.  Follow-up with your provider or [facility] for further care.

## 2015-05-21 NOTE — ED Provider Notes (Signed)
[facility] Department Provider Note ____________________________________________  Time seen: [phone]  I have reviewed the triage vital signs and the nursing notes.  HISTORY  Chief Complaint Facial Swelling and Eye Problem  HPI Diesel Zuehlke Sr. is a 64 y.o. male who reports to the ED with 2 days complaint of right eye swelling. He describes that he had a small bump to the right brow, which he actively busted. From there he developed redness and swelling from the brow. The upper lid. He denies any visual changes, fever, chills, sweats, or headache.  Past Medical History  Diagnosis Date  . Anxiety   . Osteoarthritis   . History of foot fracture   . Chronic back pain   . Essential hypertension, benign     takes Lisinopril daily but forgets on occasion  . Headache(784.0)   . Hyperlipidemia     hx of;has been off of Lipitor 6-54yrs   . Shortness of breath     with exertion  . Migraine     last one a week ago  . Seizures     has been off of Dilantin 91yr;last seizure 25yrs ago  . Joint disease   . Chronic back pain     pinced nerve  . Chronic neck pain     pinched nerve  . Depression     doesn't require meds    Patient Active Problem List   Diagnosis Date Noted  . Wound infection after surgery 06/20/2012  . Lumbar stenosis with neurogenic claudication 05/29/2012  . Herniation of cervical intervertebral disc with radiculopathy 04/18/2012  . Preoperative evaluation to rule out surgical contraindication 02/23/2012  . Abnormal ECG 02/23/2012  . Essential hypertension, benign 02/23/2012  . Shortness of breath 02/23/2012  . Tobacco abuse 02/23/2012    Past Surgical History  Procedure Laterality Date  . Posterior cervical laminectomy  04/18/2012    Procedure: POSTERIOR CERVICAL LAMINECTOMY;  Surgeon: Temple Pacini, MD;  Location: [facility];  Service: Neurosurgery;  Laterality: Right;  Right Cervical Seven-thoracic One  Lamicectomy/Foraminotomy/Microdiscectomy  . Lumbar laminectomy/decompression microdiscectomy  05/29/2012    Procedure: LUMBAR LAMINECTOMY/DECOMPRESSION MICRODISCECTOMY 2 LEVELS;  Surgeon: Temple Pacini, MD;  Location: [facility];  Service: Neurosurgery;  Laterality: Bilateral;  Bilateral Lumbar three through five decompressive lumbar laminectomy   . Lumbar wound debridement  06/19/2012    Procedure: LUMBAR WOUND DEBRIDEMENT;  Surgeon: Temple Pacini, MD;  Location: [facility];  Service: Neurosurgery;  Laterality: N/A;  Irrigation and Debridement of Lumbar Wound    Current Outpatient Rx  Name  Route  Sig  Dispense  Refill  . cephALEXin (KEFLEX) 500 MG capsule   Oral   Take 1 capsule (500 mg total) by mouth 3 (three) times daily.   21 capsule   0   . clonazePAM (KLONOPIN) 0.5 MG tablet   Oral   Take 0.5 mg by mouth daily.         . cyclobenzaprine (FLEXERIL) 10 MG tablet   Oral   Take 10 mg by mouth 3 (three) times daily as needed. For pain         . dextrose 5 % SOLN 50 mL with ceFAZolin 1 G SOLR 2 g ivpb   Intravenous   Inject 2 g into the vein every 8 (eight) hours.   3000 mL   1     Home antibiotic therapy   . lisinopril (PRINIVIL,ZESTRIL) 10 MG tablet   Oral   Take 10 mg  by mouth daily.          Allergies Review of patient's allergies indicates no known allergies.  Family History  Problem Relation Age of Onset  . Hypertension Mother   . Stroke Mother   . Diabetes type II Brother   . Coronary artery disease Brother   . Diabetes type II Sister   . Anesthesia problems Neg Hx   . Hypotension Neg Hx   . Malignant hyperthermia Neg Hx   . Pseudochol deficiency Neg Hx    Social History History  Substance Use Topics  . Smoking status: Current Every Day Smoker -- 0.50 packs/day for 44 years    Types: Cigarettes, Cigars  . Smokeless tobacco: Never Used  . Alcohol Use: 0.6 oz/week    1 Cans of beer per week     Comment: Regular intake-gin   Review of  Systems  Constitutional: Negative for fever. Eyes: Negative for visual changes. ENT: Negative for sore throat. Cardiovascular: Negative for chest pain. Respiratory: Negative for shortness of breath. Gastrointestinal: Negative for abdominal pain, vomiting and diarrhea. Genitourinary: Negative for dysuria. Musculoskeletal: Negative for back pain. Skin: Negative for rash. Positive for eyelid swelling. Neurological: Negative for headaches, focal weakness or numbness. ____________________________________________  PHYSICAL EXAM:  VITAL SIGNS: ED Triage Vitals  Enc Vitals Group     BP 05/21/15 0734 152/73 mmHg     Pulse Rate 05/21/15 0734 71     Resp 05/21/15 0734 18     Temp 05/21/15 0734 98.2 F (36.8 C)     Temp Source 05/21/15 0734 Oral     SpO2 05/21/15 0734 99 %     Weight 05/21/15 0734 150 lb (68.04 kg)     Height 05/21/15 0734  (1.727 m)     Head Cir --      Peak Flow --      Pain Score 05/21/15 0747 0     Pain Loc --      Pain Edu? --      Excl. in GC? --    Constitutional: Alert and oriented. Well appearing and in no distress. Eyes: Conjunctivae are normal. PERRL. Normal extraocular movements. Upper lid with moderate swelling and erythema. Scabbed right brow papule noted.  ENT   Head: Normocephalic and atraumatic.   Nose: No congestion/rhinnorhea.   Mouth/Throat: Mucous membranes are moist.   Neck: No stridor. Hematological/Lymphatic/Immunilogical: No cervical or preauricular lymphadenopathy. Cardiovascular: Normal rate, regular rhythm.  Respiratory: Normal respiratory effort. Musculoskeletal: Nontender with normal range of motion in all extremities. Neurologic:  Normal speech and language. No gross focal neurologic deficits are appreciated. Skin:  Skin is warm, dry and intact. No rash noted. Psychiatric: Mood and affect are normal. Patient exhibits appropriate insight and judgment. ____________________________________________  INITIAL IMPRESSION  / ASSESSMENT AND PLAN / ED COURSE  Right brow cellulitis with dependent lid edema/blepharitis. Treat with Keflex and cool compresses.  ____________________________________________  FINAL CLINICAL IMPRESSION(S) / ED DIAGNOSES  Final diagnoses:  Cellulitis of face  Blepharitis of right upper eyelid     Lissa Hoard, PA-C 05/21/15 4098  Gayla Doss, MD 05/27/15 [phone]

## 2015-05-22 ENCOUNTER — Encounter: Payer: Self-pay | Admitting: Emergency Medicine

## 2015-05-22 ENCOUNTER — Emergency Department
Admission: EM | Admit: 2015-05-22 | Discharge: 2015-05-22 | Disposition: A | Discharge status: Home / Self Care | Payer: Medicaid Other | Attending: Emergency Medicine | Admitting: Emergency Medicine

## 2015-05-22 DIAGNOSIS — R220 Localized swelling, mass and lump, head: Secondary | ICD-10-CM | POA: Diagnosis present

## 2015-05-22 DIAGNOSIS — L03211 Cellulitis of face: Secondary | ICD-10-CM | POA: Insufficient documentation

## 2015-05-22 DIAGNOSIS — R600 Localized edema: Secondary | ICD-10-CM | POA: Diagnosis not present

## 2015-05-22 DIAGNOSIS — Z720 Tobacco use: Secondary | ICD-10-CM | POA: Diagnosis not present

## 2015-05-22 DIAGNOSIS — I10 Essential (primary) hypertension: Secondary | ICD-10-CM | POA: Insufficient documentation

## 2015-05-22 DIAGNOSIS — Z79899 Other long term (current) drug therapy: Secondary | ICD-10-CM | POA: Diagnosis not present

## 2015-05-22 LAB — CBC WITH DIFFERENTIAL/PLATELET
BASOS ABS: 0 10*3/uL (ref 0–0.1)
Basophils Relative: 1 %
EOS PCT: 1 %
Eosinophils Absolute: 0.1 10*3/uL (ref 0–0.7)
HEMATOCRIT: 39.7 % — AB (ref 40.0–52.0)
HEMOGLOBIN: 12.8 g/dL — AB (ref 13.0–18.0)
LYMPHS PCT: 22 %
Lymphs Abs: 1.7 10*3/uL (ref 1.0–3.6)
MCH: 30 pg (ref 26.0–34.0)
MCHC: 32.2 g/dL (ref 32.0–36.0)
MCV: 93.3 fL (ref 80.0–100.0)
MONO ABS: 0.7 10*3/uL (ref 0.2–1.0)
MONOS PCT: 9 %
Neutro Abs: 5.2 10*3/uL (ref 1.4–6.5)
Neutrophils Relative %: 67 %
Platelets: 206 10*3/uL (ref 150–440)
RBC: 4.26 MIL/uL — ABNORMAL LOW (ref 4.40–5.90)
RDW: 14.5 % (ref 11.5–14.5)
WBC: 7.6 10*3/uL (ref 3.8–10.6)

## 2015-05-22 LAB — BASIC METABOLIC PANEL
ANION GAP: 4 — AB (ref 5–15)
BUN: 9 mg/dL (ref 6–20)
CHLORIDE: 106 mmol/L (ref 101–111)
CO2: 28 mmol/L (ref 22–32)
CREATININE: 1.11 mg/dL (ref 0.61–1.24)
Calcium: 8.6 mg/dL — ABNORMAL LOW (ref 8.9–10.3)
GFR calc Af Amer: >60 mL/min (ref >60)
GFR calc non Af Amer: >60 mL/min (ref >60)
Glucose, Bld: 94 mg/dL (ref 65–99)
Potassium: 3.8 mmol/L (ref 3.5–5.1)
Sodium: 138 mmol/L (ref 135–145)

## 2015-05-22 MED ORDER — KETOROLAC TROMETHAMINE 30 MG/ML IJ SOLN
30.0000 mg | Freq: Once | INTRAMUSCULAR | Status: AC
  Administered 2015-05-22: 30 mg via INTRAVENOUS

## 2015-05-22 MED ORDER — CLINDAMYCIN HCL 300 MG PO CAPS: 300.0000 mg | ORAL_CAPSULE | Freq: Four times a day (QID) | ORAL | Status: DC

## 2015-05-22 MED ORDER — CLINDAMYCIN PHOSPHATE 600 MG/50ML IV SOLN
INTRAVENOUS | Status: AC
Start: 2015-05-22 — End: 2015-05-22
  Administered 2015-05-22: 600 mg via INTRAVENOUS
  Filled 2015-05-22: qty 50

## 2015-05-22 MED ORDER — CLINDAMYCIN PHOSPHATE 600 MG/50ML IV SOLN
600.0000 mg | Freq: Once | INTRAVENOUS | Status: AC
  Administered 2015-05-22: 600 mg via INTRAVENOUS

## 2015-05-22 MED ORDER — KETOROLAC TROMETHAMINE 30 MG/ML IJ SOLN
INTRAMUSCULAR | Status: AC
  Administered 2015-05-22: 30 mg via INTRAVENOUS
  Filled 2015-05-22: qty 1

## 2015-05-22 MED ORDER — KETOROLAC TROMETHAMINE 10 MG PO TABS: 10.0000 mg | ORAL_TABLET | Freq: Three times a day (TID) | ORAL | Status: DC

## 2015-05-22 NOTE — Discharge Instructions (Signed)
Cellulitis Cellulitis is an infection of the skin and the tissue under the skin. The infected area is usually red and tender. This happens most often in the arms and lower legs. HOME CARE   Take your antibiotic medicine as told. Finish the medicine even if you start to feel better.  Keep the infected arm or leg raised (elevated).  Put a warm cloth on the area up to 4 times per day.  Only take medicines as told by your doctor.  Keep all doctor visits as told. GET HELP IF:  You see red streaks on the skin coming from the infected area.  Your red area gets bigger or turns a dark color.  Your bone or joint under the infected area is painful after the skin heals.  Your infection comes back in the same area or different area.  You have a puffy (swollen) bump in the infected area.  You have new symptoms.  You have a fever. GET HELP RIGHT AWAY IF:   You feel very sleepy.  You throw up (vomit) or have watery poop (diarrhea).  You feel sick and have muscle aches and pains. MAKE SURE YOU:   Understand these instructions.  Will watch your condition.  Will get help right away if you are not doing well or get worse. Document Released: 05/10/2008 Document Revised: 04/08/2014 Document Reviewed: 02/07/2012 [facility] Patient Information 2015 [place], [place]. This information is not intended to replace advice given to you by your health care provider. Make sure you discuss any questions you have with your health care provider.  Change the antibiotic to the Clindamycin as directed.  Apply warm compresses to the brow to promote healing.  Follow-up with your provider for wound check, or return here as needed.

## 2015-05-22 NOTE — ED Notes (Signed)
he3 for increased swelling to right eye

## 2015-05-22 NOTE — ED Provider Notes (Signed)
[facility] Emergency Department Provider Note ____________________________________________  Time seen: 1050  I have reviewed the triage vital signs and the nursing notes.  HISTORY  Chief Complaint No chief complaint on file.  HPI Arthur Cahoon Sr. is a 64 y.o. male returns to the ED today for reevaluation of continued swelling to the right thigh. He was evaluated and treated by me for a small right brow cellulitis and upper lid blepharitis yesterday. He was started on Keflex antibiotics, and reports today that he had some improvement of his symptoms yesterday using cold and hot compresses. He awoke today with increased swelling now extending to the lower lid, increased tenderness to the upper brow, and tender lymph nodes in front of the right ear. He denies any interim fever, chills, sweats, or vision changes.  Past Medical History  Diagnosis Date  . Anxiety   . Osteoarthritis   . History of foot fracture   . Chronic back pain   . Essential hypertension, benign     takes Lisinopril daily but forgets on occasion  . Headache(784.0)   . Hyperlipidemia     hx of;has been off of Lipitor 6-70yrs   . Shortness of breath     with exertion  . Migraine     last one a week ago  . Seizures     has been off of Dilantin 69yr;last seizure 54yrs ago  . Joint disease   . Chronic back pain     pinced nerve  . Chronic neck pain     pinched nerve  . Depression     doesn't require meds   Patient Active Problem List   Diagnosis Date Noted  . Wound infection after surgery 06/20/2012  . Lumbar stenosis with neurogenic claudication 05/29/2012  . Herniation of cervical intervertebral disc with radiculopathy 04/18/2012  . Preoperative evaluation to rule out surgical contraindication 02/23/2012  . Abnormal ECG 02/23/2012  . Essential hypertension, benign 02/23/2012  . Shortness of breath 02/23/2012  . Tobacco abuse 02/23/2012   Past Surgical History  Procedure Laterality  Date  . Posterior cervical laminectomy  04/18/2012    Procedure: POSTERIOR CERVICAL LAMINECTOMY;  Surgeon: Temple Pacini, MD;  Location: [facility];  Service: Neurosurgery;  Laterality: Right;  Right Cervical Seven-thoracic One Lamicectomy/Foraminotomy/Microdiscectomy  . Lumbar laminectomy/decompression microdiscectomy  05/29/2012    Procedure: LUMBAR LAMINECTOMY/DECOMPRESSION MICRODISCECTOMY 2 LEVELS;  Surgeon: Temple Pacini, MD;  Location: [facility];  Service: Neurosurgery;  Laterality: Bilateral;  Bilateral Lumbar three through five decompressive lumbar laminectomy   . Lumbar wound debridement  06/19/2012    Procedure: LUMBAR WOUND DEBRIDEMENT;  Surgeon: Temple Pacini, MD;  Location: [facility];  Service: Neurosurgery;  Laterality: N/A;  Irrigation and Debridement of Lumbar Wound    Current Outpatient Rx  Name  Route  Sig  Dispense  Refill  . clindamycin (CLEOCIN) 300 MG capsule   Oral   Take 1 capsule (300 mg total) by mouth 4 (four) times daily.   40 capsule   0   . clonazePAM (KLONOPIN) 0.5 MG tablet   Oral   Take 0.5 mg by mouth daily.         . cyclobenzaprine (FLEXERIL) 10 MG tablet   Oral   Take 10 mg by mouth 3 (three) times daily as needed. For pain         . dextrose 5 % SOLN 50 mL with ceFAZolin 1 G SOLR 2 g ivpb   Intravenous   Inject  2 g into the vein every 8 (eight) hours.   3000 mL   1     Home antibiotic therapy   . ketorolac (TORADOL) 10 MG tablet   Oral   Take 1 tablet (10 mg total) by mouth every 8 (eight) hours.   15 tablet   0   . lisinopril (PRINIVIL,ZESTRIL) 10 MG tablet   Oral   Take 10 mg by mouth daily.          Allergies Review of patient's allergies indicates no known allergies.  Family History  Problem Relation Age of Onset  . Hypertension Mother   . Stroke Mother   . Diabetes type II Brother   . Coronary artery disease Brother   . Diabetes type II Sister   . Anesthesia problems Neg Hx   . Hypotension Neg Hx   . Malignant  hyperthermia Neg Hx   . Pseudochol deficiency Neg Hx    Social History History  Substance Use Topics  . Smoking status: Current Every Day Smoker -- 0.50 packs/day for 44 years    Types: Cigarettes, Cigars  . Smokeless tobacco: Never Used  . Alcohol Use: 0.6 oz/week    1 Cans of beer per week     Comment: Regular intake-gin   Review of Systems  Constitutional: Negative for fever, chills, sweats. Eyes: Negative for visual changes. Right upper lid swelling and peri-orbital edema.  ENT: Negative for sore throat. Cardiovascular: Negative for chest pain. Respiratory: Negative for shortness of breath. Gastrointestinal: Negative for abdominal pain, vomiting and diarrhea. Genitourinary: Negative for dysuria. Musculoskeletal: Negative for back pain. Skin: Negative for rash. Neurological: Negative for headaches, focal weakness or numbness. ____________________________________________  PHYSICAL EXAM:  VITAL SIGNS: ED Triage Vitals  Enc Vitals Group     BP 05/22/15 1015 124/75 mmHg     Pulse Rate 05/22/15 1015 62     Resp 05/22/15 1015 19     Temp 05/22/15 1015 98.4 F (36.9 C)     Temp Source 05/22/15 1015 Oral     SpO2 05/22/15 1015 99 %     Weight 05/22/15 1015 150 lb (68.04 kg)     Height 05/22/15 1015  (1.727 m)     Head Cir --      Peak Flow --      Pain Score 05/22/15 1036 3     Pain Loc --      Pain Edu? --      Excl. in GC? --    Constitutional: Alert and oriented. Well appearing and in no distress. HEENT: Normocephalic and atraumatic. Conjunctivae are normal. PERRL. Normal extraocular movements. Edema of the right periorbital area with induration of the small lesion over the right brow.  No congestion/rhinnorhea. Mucous membranes are moist. Neck: Supple. No thyromegaly. Hematological/Lymphatic/Immunilogical: Tender right preauricular lymphadenopathy. Cardiovascular: Normal rate, regular rhythm.  Respiratory: Normal respiratory effort. Musculoskeletal:  Nontender with normal range of motion in all extremities.  Neurologic:  Normal speech and language. No gross focal neurologic deficits are appreciated. Skin:  Skin is warm, dry and intact. No rash noted. Psychiatric: Mood and affect are normal. Patient exhibits appropriate insight and judgment. ____________________________________________   LABS (pertinent positives/negatives)  Labs Reviewed  CBC WITH DIFFERENTIAL/PLATELET - Abnormal; Notable for the following:    RBC 4.26 (*)    Hemoglobin 12.8 (*)    HCT 39.7 (*)    All other components within normal limits  BASIC METABOLIC PANEL - Abnormal; Notable for the following:  Calcium 8.6 (*)    Anion gap 4 (*)    All other components within normal limits  ___________________________________________  PROCEDURES Clindamycin  IVPB Toradol 30 mg IVP  INCISION AND DRAINAGE Performed by: Lissa Hoard Consent: Verbal consent obtained. Risks and benefits: risks, benefits and alternatives were discussed Type: abscess  Body area: Right Brow  Anesthesia: local infiltration  Incision was made with a 18g needle.  Local anesthetic: lidocaine 1% w/ epinephrine  Anesthetic total: 0.5 ml  Complexity: simple  Drainage: purulent  Drainage amount: 1 ml  Patient tolerance: Patient tolerated the procedure well with no immediate complications. ____________________________________________  INITIAL IMPRESSION / ASSESSMENT AND PLAN / ED COURSE  Expanded periorbital edema secondary to small cellulitis of the right brow. No clinical or laboratory indication of sepsis. Simple I&D procedure with 18G needle. Treatment with clindamycin for MRSA/MSSA coverage.  ____________________________________________  FINAL CLINICAL IMPRESSION(S) / ED DIAGNOSES  Final diagnoses:  Periorbital edema  Facial cellulitis     Lissa Hoard, PA-C 05/22/15 1335  Sharyn Creamer, MD 05/22/15 1551

## 2018-10-31 ENCOUNTER — Other Ambulatory Visit: Payer: Self-pay | Admitting: Family Medicine

## 2018-10-31 DIAGNOSIS — R1031 Right lower quadrant pain: Secondary | ICD-10-CM

## 2018-11-13 ENCOUNTER — Ambulatory Visit
Admission: RE | Admit: 2018-11-13 | Discharge: 2018-11-13 | Disposition: A | Discharge status: Home / Self Care | Payer: Medicare Other | Source: Ambulatory Visit | Attending: Family Medicine | Admitting: Family Medicine

## 2018-11-13 DIAGNOSIS — R1031 Right lower quadrant pain: Secondary | ICD-10-CM | POA: Insufficient documentation

## 2018-11-13 LAB — POCT I-STAT CREATININE: Creatinine, Ser: 1 mg/dL (ref 0.61–1.24)

## 2018-11-13 MED ORDER — IOPAMIDOL (ISOVUE-300) INJECTION 61%
100.0000 mL | Freq: Once | INTRAVENOUS | Status: AC | PRN
  Administered 2018-11-13: 100 mL via INTRAVENOUS

## 2018-12-07 ENCOUNTER — Ambulatory Visit
Admission: RE | Admit: 2018-12-07 | Discharge: 2018-12-07 | Disposition: A | Discharge status: Home / Self Care | Payer: Medicare Other | Source: Ambulatory Visit | Attending: Nurse Practitioner | Admitting: Nurse Practitioner

## 2018-12-07 ENCOUNTER — Other Ambulatory Visit
Admission: RE | Admit: 2018-12-07 | Discharge: 2018-12-07 | Disposition: A | Discharge status: Home / Self Care | Payer: Medicare Other | Source: Ambulatory Visit | Attending: Nurse Practitioner | Admitting: Nurse Practitioner

## 2018-12-07 ENCOUNTER — Ambulatory Visit (HOSPITAL_BASED_OUTPATIENT_CLINIC_OR_DEPARTMENT_OTHER): Payer: Medicare Other | Admitting: Nurse Practitioner

## 2018-12-07 ENCOUNTER — Encounter (INDEPENDENT_AMBULATORY_CARE_PROVIDER_SITE_OTHER): Payer: Self-pay

## 2018-12-07 ENCOUNTER — Encounter: Payer: Self-pay | Admitting: Nurse Practitioner

## 2018-12-07 ENCOUNTER — Other Ambulatory Visit: Payer: Self-pay

## 2018-12-07 DIAGNOSIS — M5441 Lumbago with sciatica, right side: Secondary | ICD-10-CM | POA: Diagnosis not present

## 2018-12-07 DIAGNOSIS — M542 Cervicalgia: Secondary | ICD-10-CM | POA: Diagnosis present

## 2018-12-07 DIAGNOSIS — M25512 Pain in left shoulder: Secondary | ICD-10-CM | POA: Insufficient documentation

## 2018-12-07 DIAGNOSIS — M899 Disorder of bone, unspecified: Secondary | ICD-10-CM

## 2018-12-07 DIAGNOSIS — Z79899 Other long term (current) drug therapy: Secondary | ICD-10-CM

## 2018-12-07 DIAGNOSIS — M25511 Pain in right shoulder: Secondary | ICD-10-CM

## 2018-12-07 DIAGNOSIS — M79601 Pain in right arm: Secondary | ICD-10-CM

## 2018-12-07 DIAGNOSIS — M79642 Pain in left hand: Secondary | ICD-10-CM

## 2018-12-07 DIAGNOSIS — G894 Chronic pain syndrome: Secondary | ICD-10-CM

## 2018-12-07 DIAGNOSIS — M533 Sacrococcygeal disorders, not elsewhere classified: Secondary | ICD-10-CM

## 2018-12-07 DIAGNOSIS — M79641 Pain in right hand: Secondary | ICD-10-CM | POA: Insufficient documentation

## 2018-12-07 DIAGNOSIS — M79602 Pain in left arm: Secondary | ICD-10-CM

## 2018-12-07 DIAGNOSIS — G8929 Other chronic pain: Secondary | ICD-10-CM

## 2018-12-07 DIAGNOSIS — M79604 Pain in right leg: Secondary | ICD-10-CM

## 2018-12-07 DIAGNOSIS — Z789 Other specified health status: Secondary | ICD-10-CM

## 2018-12-07 LAB — COMPREHENSIVE METABOLIC PANEL
ALT: 16 U/L (ref 0–44)
AST: 24 U/L (ref 15–41)
Albumin: 4.4 g/dL (ref 3.5–5.0)
Alkaline Phosphatase: 54 U/L (ref 38–126)
Anion gap: 8 (ref 5–15)
BUN: 16 mg/dL (ref 8–23)
CO2: 27 mmol/L (ref 22–32)
Calcium: 9.1 mg/dL (ref 8.9–10.3)
Chloride: 103 mmol/L (ref 98–111)
Creatinine, Ser: 1.09 mg/dL (ref 0.61–1.24)
GFR calc Af Amer: >60 mL/min (ref >60)
GFR calc non Af Amer: >60 mL/min (ref >60)
Glucose, Bld: 95 mg/dL (ref 70–99)
Potassium: 3.6 mmol/L (ref 3.5–5.1)
Sodium: 138 mmol/L (ref 135–145)
Total Bilirubin: 0.5 mg/dL (ref 0.3–1.2)
Total Protein: 7.9 g/dL (ref 6.5–8.1)

## 2018-12-07 LAB — SEDIMENTATION RATE: SED RATE: 32 mm/h — AB (ref 0–20)

## 2018-12-07 LAB — VITAMIN B12: Vitamin B-12: 417 pg/mL (ref 180–914)

## 2018-12-07 LAB — C-REACTIVE PROTEIN: CRP: 3.4 mg/dL — ABNORMAL HIGH (ref <1.0)

## 2018-12-07 LAB — MAGNESIUM: MAGNESIUM: 2.1 mg/dL (ref 1.7–2.4)

## 2018-12-07 NOTE — Patient Instructions (Signed)

## 2018-12-07 NOTE — Progress Notes (Signed)
Safety precautions to be maintained throughout the outpatient stay will include: orient to surroundings, keep bed in low position, maintain call bell within reach at all times, provide assistance with transfer out of bed and ambulation.  

## 2018-12-07 NOTE — Progress Notes (Signed)
Patient's Name: Arthur Wilcox  MRN: [id number]  Referring Provider: Tandy Gaw, [place]  DOB: 1951-10-05  PCP: [facility] [place]: 12/07/2018  Note by: Dionisio David NP  Service setting: Ambulatory outpatient  Specialty: Interventional Pain Management  Location: [facility] (AMB) Pain Management Facility    Patient type: New Patient    Primary Reason(s) for Visit: Initial Patient Evaluation CC: Back Pain (low) and Shoulder Pain (bilateral)  HPI  Arthur Wilcox is a 68 y.o. year old, male patient, who comes today for an initial evaluation. He has Preoperative evaluation to rule out surgical contraindication; Abnormal ECG; Essential hypertension, benign; Shortness of breath; Tobacco abuse; Herniation of cervical intervertebral disc with radiculopathy; Lumbar stenosis with neurogenic claudication; Wound infection after surgery; Chronic bilateral low back pain with right-sided sciatica (Primary Area of Pain) (R>L); Chronic pain of right lower extremity (Secondary Area of Pain); Chronic pain of both shoulders (Tertiary Area of Pain) (R>L); Chronic upper extremity pain (Fourth Area of Pain) (R>L); Pain in both hands (R>L); Chronic neck pain (R>L); Chronic pain syndrome; Pharmacologic therapy; Disorder of skeletal system; Problems influencing health status; and 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 His primarily concern today is the Back Pain (low) and Shoulder Pain (bilateral)  Pain Assessment: Location: Lower Back Radiating: groin and legs bilaterally Onset: More than a month ago Duration: Chronic pain Quality: Aching, Dull Severity: 10-Worst pain ever/10 (subjective, self-reported pain score)  Note: Reported level is compatible with observation. Clinically the patient looks like a 3/10 A 3/10 is viewed as "Moderate" and described as significantly interfering with activities of daily living (ADL). It becomes difficult to feed, bathe, get dressed, get on and off the toilet or to  perform personal hygiene functions. Difficult to get in and out of bed or a chair without assistance. Very distracting. With effort, it can be ignored when deeply involved in activities. Information on the proper use of the pain scale provided to the patient today. When using our objective Pain Scale, levels between 6 and 10/10 are said to belong in an emergency room, as it progressively worsens from a 6/10, described as severely limiting, requiring emergency care not usually available at an outpatient pain management facility. At a 6/10 level, communication becomes difficult and requires great effort. Assistance to reach the emergency department may be required. Facial flushing and profuse sweating along with potentially dangerous increases in heart rate and blood pressure will be evident. Effect on ADL:   Timing: Constant Modifying factors: positioning at times BP: (!) 156/73  HR: 62  Onset and Duration: Date of onset: 1998 Cause of pain: Surgery Severity: Getting worse Timing: Not influenced by the time of the day Aggravating Factors: Intercourse (sex), Kneeling, Lifiting, Prolonged sitting, Prolonged standing, Squatting, Stooping , Surgery made it worse, Twisting, Walking, Walking uphill, Walking downhill and Working Alleviating Factors: Hot packs, Lying down, Resting and Warm showers or baths Associated Problems: Inability to control bladder (urine), Numbness, Sweating, Swelling, Tingling, Weakness, Pain that wakes patient up and Pain that does not allow patient to sleep Quality of Pain: Burning, Deep, Dull, Nagging, Pressure-like, Pulsating, Sharp, Shooting, Tender, Toothache-like and Uncomfortable Previous Examinations or Tests: CT scan Previous Treatments: The patient denies .  The patient comes into the clinics today for the first time for a chronic pain management evaluation.  He admits that his primary area of pain is in his lower back.  The pain varies but the right is greater than the  left.  He is  status post lumbar spinal surgery with Dr. Trenton Gammon 2013.  He did have a to have a revision.  He admits that he did have injections prior to the surgery with [facility] pain clinic however they were not effective at that time.  He denies any recent physical therapy or recent images.   His second area of pain is in his legs.  He admits that the pain from the back radiates down the right side into his groin and the inner part of his thigh.  He has occasional numbness tingling.  His third area pain is in his shoulders.  He admits the right is greater than the left.  He denies any previous surgery.  He did have injections at [facility] greater than 10 years ago.  He denies any recent images.  His fourth area of pain is in his upper extremities.  He has numbness tingling and weakness in his arms that goes into the hands.  He admits that his thumbs lock up on occasion.  He is currently taking meloxicam however this is not effective.  He states that he does have some occasional neck pain.  The right side being greater than the left.  He states that he did have surgery on his neck in the past.  He denies any recent physical therapy or images   Today I took the time to provide the patient with information regarding this pain practice. The patient was informed that the practice is divided into two sections: an interventional pain management section, as well as a completely separate and distinct medication management section. I explained that there are procedure days for interventional therapies, and evaluation days for follow-ups and medication management. Because of the amount of documentation required during both, they are kept separated. This means that there is the possibility that he may be scheduled for a procedure on one day, and medication management the next. I have also informed him that because of staffing and facility limitations, this practice will no longer take patients for medication  management only. To illustrate the reasons for this, I gave the patient the example of surgeons, and how inappropriate it would be to refer a patient to his/her care, just to write for the post-surgical antibiotics on a surgery done by a different surgeon.   Because interventional pain management is part of the board-certified specialty for the doctors, the patient was informed that joining this practice means that they are open to any and all interventional therapies. I made it clear that this does not mean that they will be forced to have any procedures done. What this means is that I believe interventional therapies to be essential part of the diagnosis and proper management of chronic pain conditions. Therefore, patients not interested in these interventional alternatives will be better served under the care of a different practitioner.  The patient was also made aware of my Comprehensive Pain Management Safety Guidelines where by joining this practice, they limit all of their nerve blocks and joint injections to those done by our practice, for as long as we are retained to manage their care. Historic Controlled Substance Pharmacotherapy Review  PMP and historical list of controlled substances: None  Medications: The patient did not bring the medication(s) to the appointment, as requested in our "New Patient Package" Pharmacodynamics: Desired effects: Analgesia: The patient reports >50% benefit. Reported improvement in function: The patient reports medication allows him to accomplish basic ADLs. Clinically meaningful improvement in function (CMIF): Sustained CMIF goals met Perceived  effectiveness: Described as relatively effective, allowing for increase in activities of daily living (ADL) Undesirable effects: Side-effects or Adverse reactions: None reported Historical Monitoring: The patient  reports current drug use. Drug: Marijuana. List of all UDS Test(s): No results found for: MDMA,  COCAINSCRNUR, PCPSCRNUR, PCPQUANT, CANNABQUANT, THCU, [place] List of all Serum Drug Screening Test(s):  No results found for: AMPHSCRSER, BARBSCRSER, BENZOSCRSER, COCAINSCRSER, PCPSCRSER, PCPQUANT, THCSCRSER, CANNABQUANT, OPIATESCRSER, OXYSCRSER, PROPOXSCRSER Historical Background Evaluation: [place] PDMP: Six (6) year initial data search conducted.             [place] Department of public safety, offender search: Editor, commissioning Information) Non-contributory Risk Assessment Profile: Aberrant behavior: None observed or detected today Risk factors for fatal opioid overdose: history of substance abuse, history of substance use disorder and male gender Fatal overdose hazard ratio (HR): Calculation deferred Non-fatal overdose hazard ratio (HR): Calculation deferred Risk of opioid abuse or dependence: 0.7-3.0% with doses ? 36 MME/day and 6.1-26% with doses ? 120 MME/day. Substance use disorder (SUD) risk level: Pending results of Medical Psychology Evaluation for SUD Opioid risk tool (ORT) (Total Score): 0  ORT Scoring interpretation table:  Score <3 = Low Risk for SUD  Score between 4-7 = Moderate Risk for SUD  Score >8 = High Risk for Opioid Abuse   PHQ-2 Depression Scale:  Total score: 0  PHQ-2 Scoring interpretation table: (Score and probability of major depressive disorder)  Score 0 = No depression  Score 1 = 15.4% Probability  Score 2 = 21.1% Probability  Score 3 = 38.4% Probability  Score 4 = 45.5% Probability  Score 5 = 56.4% Probability  Score 6 = 78.6% Probability   PHQ-9 Depression Scale:  Total score: 0  PHQ-9 Scoring interpretation table:  Score 0-4 = No depression  Score 5-9 = Mild depression  Score 10-14 = Moderate depression  Score 15-19 = Moderately severe depression  Score 20-27 = Severe depression (2.4 times higher risk of SUD and 2.89 times higher risk of overuse)   Pharmacologic Plan: Pending ordered tests and/or consults  Meds  The patient has a current medication list which  includes the following prescription(s): atorvastatin, cyclobenzaprine, meloxicam, and sildenafil.  Current Outpatient Medications on File Prior to Visit  Medication Sig  . atorvastatin (LIPITOR) 40 MG tablet Take 40 mg by mouth daily.  . cyclobenzaprine (FLEXERIL) 10 MG tablet Take 10 mg by mouth at bedtime as needed. For pain  . meloxicam (MOBIC) 15 MG tablet Take 15 mg by mouth daily.  . sildenafil (VIAGRA) 100 MG tablet Take 100 mg by mouth daily as needed for erectile dysfunction.   No current facility-administered medications on file prior to visit.    Imaging Review  Cervical Imaging:  Cervical DG 1 view:  Results for orders placed during the hospital encounter of 04/18/12  DG Cervical Spine 1 View   Narrative *RADIOLOGY REPORT*  Clinical Data: For cervical laminectomy  DG CERVICAL SPINE - 1 VIEW  Comparison: None.  Findings: The lower cervical spine is difficult to assess on the portable film obtained.  The instrument for localization is positioned posteriorly directed probably toward the C 7-T1 level although the film is too light in that region to adequately assess. There are degenerative changes from C4-C7 noted with loss of disc space and spurring.  IMPRESSION: The film is inadequate for localization of the instrument positioned posteriorly.  Original Report Authenticated By: Joretta Bachelor, M.D.   Lumbosacral Imaging:  Lumbar MR w/wo contrast:  Results for orders placed during  the hospital encounter of 06/19/12  MR Lumbar Spine W Wo Contrast   Narrative *RADIOLOGY REPORT*  Clinical Data: 68 year old male with severe pain.  Lumbar surgery 3 weeks ago with drainage from the incision site.  MRI LUMBAR SPINE WITHOUT AND WITH CONTRAST  Technique:  Multiplanar and multiecho pulse sequences of the lumbar spine were obtained without and with intravenous contrast.  Contrast: 16m MULTIHANCE GADOBENATE DIMEGLUMINE 529 MG/ML IV SOLN  Comparison: Intraoperative  lumbar radiograph 05/29/2012.  CT abdomen pelvis 06/17/2012.  Findings: Postoperative changes posteriorly with a subcutaneous fluid collection extending from L2-L3 nearly to L5-L1 encompassing 6 x 3.4 x 6.6 cm (AP by transverse by CC).  Moderate to intense posterior paraspinal muscle enhancement surrounding the collection which tracks to the thecal sac at L3-L4 and L4-L5.  There is abnormal dural thickening and enhancement extending from L2 to S1.  There is evidence of epidural or subdural component to the collection with ventral displacement of nerve roots at L2-L3 (series 8 image 7, series 5 image 7).  There is a similar sized ventral epidural rim enhancing collection at the L4 vertebral body level.  Combined, there is severe spinal stenosis from L3-L4 to L4- L5, with severe right lateral recess stenosis at the latter.  Furthermore, there is abnormal marrow signal within the L3, L4, and L5 spinous processes.  The previously described fluid collection may also communicate with the bilateral L3-L4 facet joints which appear abnormal.   Visualized lower thoracic spinal cord is normal with conus medularis at L2.  No psoas muscle inflammation.  No prevertebral inflammation. Negative visualized abdominal viscera.  IMPRESSION: Constellation of findings compatible with acute spinal infection including ventral and dorsal epidural abscesses, posterior element osteomyelitis, and posterior paraspinal myositis from L2-L3 to L5- S1 as detailed above.  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  I reached Dr. PAnnette Stablevia an OR tech at the 0820 hours on 06/19/2012. He related that he had seen this exam, and that repeat surgery on this patient is pending this morning.  I advised him to call me with any questions he may have regarding the above report.  Original Report Authenticated By: HRandall An M.D.   Lumbar DG 1V:  Results for orders placed during the hospital encounter of  05/29/12  DG Lumbar Spine 1 View   Narrative *RADIOLOGY REPORT*  Clinical Data: L3-L5 laminectomy and discectomy.  LUMBAR SPINE - 1 VIEW  Comparison: None.  Findings: Single image shows tissue spreaders posteriorly with a probe between the spinous processes of L3 and L4, directed at the pedicle level of L4.  IMPRESSION: Pedicle level L4 localized.  Original Report Authenticated By: MJules Schick M.D.   Note: Available results from prior imaging studies were reviewed.        ROS  Cardiovascular History: No reported cardiovascular signs or symptoms such as High blood pressure, coronary artery disease, abnormal heart rate or rhythm, heart attack, blood thinner therapy or heart weakness and/or failure Pulmonary or Respiratory History: Shortness of breath and Smoking Neurological History: Seizure disorder Review of Past Neurological Studies: No results found for this or any previous visit. Psychological-Psychiatric History: No reported psychological or psychiatric signs or symptoms such as difficulty sleeping, anxiety, depression, delusions or hallucinations (schizophrenial), mood swings (bipolar disorders) or suicidal ideations or attempts Gastrointestinal History: No reported gastrointestinal signs or symptoms such as vomiting or evacuating blood, reflux, heartburn, alternating episodes of diarrhea and constipation, inflamed or scarred liver, or pancreas or irrregular and/or infrequent bowel movements  Genitourinary History: No reported renal or genitourinary signs or symptoms such as difficulty voiding or producing urine, peeing blood, non-functioning kidney, kidney stones, difficulty emptying the bladder, difficulty controlling the flow of urine, or chronic kidney disease Hematological History: No reported hematological signs or symptoms such as prolonged bleeding, low or poor functioning platelets, bruising or bleeding easily, hereditary bleeding problems, low energy levels due to low  hemoglobin or being anemic Endocrine History: No reported endocrine signs or symptoms such as high or low blood sugar, rapid heart rate due to high thyroid levels, obesity or weight gain due to slow thyroid or thyroid disease Rheumatologic History: No reported rheumatological signs and symptoms such as fatigue, joint pain, tenderness, swelling, redness, heat, stiffness, decreased range of motion, with or without associated rash Musculoskeletal History: Negative for myasthenia gravis, muscular dystrophy, multiple sclerosis or malignant hyperthermia Work History: Retired  Allergies  Mr. Bonfanti has No Known Allergies.  Laboratory Chemistry  Inflammation Markers Lab Results  Component Value Date   CRP 26.0 (H) 06/19/2012   ESRSEDRATE 32 (H) 12/07/2018   (CRP: Acute Phase) (ESR: Chronic Phase) Renal Function Markers Lab Results  Component Value Date   BUN 16 12/07/2018   CREATININE 1.09 12/07/2018   GFRAA >60 12/07/2018   GFRNONAA >60 12/07/2018   Hepatic Function Markers Lab Results  Component Value Date   AST 24 12/07/2018   ALT 16 12/07/2018   ALBUMIN 4.4 12/07/2018   ALKPHOS 54 12/07/2018   Electrolytes Lab Results  Component Value Date   NA 138 12/07/2018   K 3.6 12/07/2018   CL 103 12/07/2018   CALCIUM 9.1 12/07/2018   MG 2.1 12/07/2018   Neuropathy Markers No results found for: [id number] Bone Pathology Markers Lab Results  Component Value Date   ALKPHOS 54 12/07/2018   CALCIUM 9.1 12/07/2018   Coagulation Parameters Lab Results  Component Value Date   INR 1.0 06/18/2012   LABPROT 13.2 06/18/2012   PLT 206 05/22/2015   Cardiovascular Markers Lab Results  Component Value Date   HGB 12.8 (L) 05/22/2015   HCT 39.7 (L) 05/22/2015   Note: Lab results reviewed.  PFSH  Drug: Mr. Foskey  reports current drug use. Drug: Marijuana. Alcohol:  reports current alcohol use of about 1.0 standard drinks of alcohol per week. Tobacco:  reports that he has been smoking  cigarettes and cigars. He has a 22.00 pack-year smoking history. He has never used smokeless tobacco. Medical:  has a past medical history of Anxiety, Chronic back pain, Chronic back pain, Chronic neck pain, Depression, Essential hypertension, benign, Headache(784.0), History of foot fracture, Hyperlipidemia, Joint disease, Migraine, Osteoarthritis, Seizures ([place]), and Shortness of breath. Family: family history includes Coronary artery disease in his brother; Diabetes type II in his brother and sister; Hypertension in his mother; Stroke in his mother.  Past Surgical History:  Procedure Laterality Date  . LUMBAR LAMINECTOMY/DECOMPRESSION MICRODISCECTOMY  05/29/2012   Procedure: LUMBAR LAMINECTOMY/DECOMPRESSION MICRODISCECTOMY 2 LEVELS;  Surgeon: Charlie Pitter, MD;  Location: [place] [facility];  Service: Neurosurgery;  Laterality: Bilateral;  Bilateral Lumbar three through five decompressive lumbar laminectomy   . LUMBAR WOUND DEBRIDEMENT  06/19/2012   Procedure: LUMBAR WOUND DEBRIDEMENT;  Surgeon: Charlie Pitter, MD;  Location: [place] [facility];  Service: Neurosurgery;  Laterality: N/A;  Irrigation and Debridement of Lumbar Wound  . POSTERIOR CERVICAL LAMINECTOMY  04/18/2012   Procedure: POSTERIOR CERVICAL LAMINECTOMY;  Surgeon: Charlie Pitter, MD;  Location: [place] [facility];  Service: Neurosurgery;  Laterality: Right;  Right Cervical Seven-thoracic One Lamicectomy/Foraminotomy/Microdiscectomy   Active Ambulatory Problems    Diagnosis Date Noted  . Preoperative evaluation to rule out surgical contraindication 02/23/2012  . Abnormal ECG 02/23/2012  . Essential hypertension, benign 02/23/2012  . Shortness of breath 02/23/2012  . Tobacco abuse 02/23/2012  . Herniation of cervical intervertebral disc with radiculopathy 04/18/2012  . Lumbar stenosis with neurogenic claudication 05/29/2012  . Wound infection after surgery 06/20/2012  . Chronic bilateral low back pain with right-sided sciatica (Primary Area of Pain)  (R>L) 12/07/2018  . Chronic pain of right lower extremity (Secondary Area of Pain) 12/07/2018  . Chronic pain of both shoulders [facility] Area of Pain) (R>L) 12/07/2018  . Chronic upper extremity pain (Fourth Area of Pain) (R>L) 12/07/2018  . Pain in both hands (R>L) 12/07/2018  . Chronic neck pain (R>L) 12/07/2018  . Chronic pain syndrome 12/07/2018  . Pharmacologic therapy 12/07/2018  . Disorder of skeletal system 12/07/2018  . Problems influencing health status 12/07/2018  . 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 No Resolved Ambulatory Problems   Past Medical History:  Diagnosis Date  . Anxiety   . Chronic back pain   . Chronic back pain   . Depression   . Headache(784.0)   . History of foot fracture   . Hyperlipidemia   . Joint disease   . Migraine   . Osteoarthritis   . Seizures ([place])    Constitutional Exam  General appearance: Well nourished, well developed, and well hydrated. In no apparent acute distress Vitals:   12/07/18 1027  BP: (!) 156/73  Pulse: 62  Resp: 18  Temp: 98.1 F (36.7 C)  TempSrc: Oral  SpO2: 99%  Weight: 167 lb (75.8 kg)  Height: '5\' 8"'  (1.727 m)   BMI Assessment: Estimated body mass index is 25.39 kg/m as calculated from the following:   Height as of this encounter: '5\' 8"'  (1.727 m).   Weight as of this encounter: 167 lb (75.8 kg).  BMI interpretation table: BMI level Category Range association with higher incidence of chronic pain  <18 kg/m2 Underweight   18.5-24.9 kg/m2 Ideal body weight   25-29.9 kg/m2 Overweight Increased incidence by 20%  30-34.9 kg/m2 Obese (Class I) Increased incidence by 68%  35-39.9 kg/m2 Severe obesity (Class II) Increased incidence by 136%  >40 kg/m2 Extreme obesity (Class III) Increased incidence by 254%   BMI Readings from Last 4 Encounters:  12/07/18 25.39 kg/m  05/22/15 22.81 kg/m  05/21/15 22.81 kg/m  06/19/12 22.16 kg/m   Wt  Readings from Last 4 Encounters:  12/07/18 167 lb (75.8 kg)  05/22/15 150 lb (68 kg)  05/21/15 150 lb (68 kg)  06/19/12 145 lb 11.6 oz (66.1 kg)  Psych/Mental status: Alert, oriented x 3 (person, place, & time)       Eyes: PERLA Respiratory: No evidence of acute respiratory distress  Cervical Spine Exam  Inspection: No masses, redness, or swelling Alignment: Symmetrical Functional ROM: Decreased ROM     to the right Stability: No instability detected Muscle strength & Tone: Guarding observed Sensory: Unimpaired Palpation: Complains of area being tender to palpation              Upper Extremity (UE) Exam    Side: Right upper extremity  Side: Left upper extremity  Inspection: Edema  Inspection: Edema  Functional ROM: Pain restricted ROM for shoulder and hands  Functional ROM: Pain restricted ROM for shoulder and  hands  Muscle strength & Tone: Movement possible against gravity, but not against resistance (3/5) hands  Muscle strength & Tone: Movement possible against gravity, but not against resistance (3/5) hands  Sensory: Unimpaired  Sensory: Unimpaired  Palpation: Complains of area being tender to palpation              Palpation: Complains of area being tender to palpation              Specialized Test(s): Deferred         Specialized Test(s): Deferred          Thoracic Spine Exam  Inspection: No masses, redness, or swelling Alignment: Symmetrical Functional ROM: Unrestricted ROM Stability: No instability detected Sensory: Unimpaired Muscle strength & Tone: No palpable anomalies  Lumbar Spine Exam  Inspection: Well healed scar from previous spine surgery detected Alignment: Symmetrical Functional ROM: Decreased ROM      Stability: No instability detected Muscle strength & Tone: Increased muscle tone over affected area Sensory: Unimpaired Palpation: Complains of area being tender to palpation       Provocative Tests: Lumbar Hyperextension and rotation test: Unable to  perform due to pain. Patrick's Maneuver: Positive for bilateral S-I arthralgia              Gait & Posture Assessment  Ambulation: Patient ambulates using a cane Gait: Antalgic Posture: Antalgic   Lower Extremity Exam    Side: Right lower extremity  Side: Left lower extremity  Inspection: No masses, redness, swelling, or asymmetry. No contractures  Inspection: No masses, redness, swelling, or asymmetry. No contractures  Functional ROM: Unrestricted ROM          Functional ROM: Unrestricted ROM          Muscle strength & Tone: Functionally intact  Muscle strength & Tone: Functionally intact  Sensory: Unimpaired  Sensory: Unimpaired  Palpation: No palpable anomalies  Palpation: No palpable anomalies   Assessment  Primary Diagnosis & Pertinent Problem List: Diagnoses of Chronic bilateral low back pain with right-sided sciatica, Chronic pain of right lower extremity, Chronic pain of both shoulders (Tertiary Area of Pain) (R>L), Chronic pain of both upper extremities, Pain in both hands (R>L), Chronic neck pain (R>L), Chronic pain syndrome, Pharmacologic therapy, Disorder of skeletal system, Problems influencing health status, and 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  Visit Diagnosis: 1. Chronic bilateral low back pain with right-sided sciatica   2. Chronic pain of right lower extremity   3. Chronic pain of both shoulders (Tertiary Area of Pain) (R>L)   4. Chronic pain of both upper extremities   5. Pain in both hands (R>L)   6. Chronic neck pain (R>L)   7. Chronic pain syndrome   8. Pharmacologic therapy   9. Disorder of skeletal system   10. Problems influencing health status   11. 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 We have not taken over the patient's controlled substance management.  Problem-specific plan: No problem-specific  Assessment & Plan notes found for this encounter.  Ordered Lab-work, Procedure(s), Referral(s), & Consult(s): Orders Placed This Encounter  Procedures  . DG Lumbar Spine Complete W/Bend  . DG Si Joints  . DG Cervical Spine With Flex & Extend  . DG Hand Complete Left  . DG Shoulder Left  . DG Shoulder Right  . DG Hand Complete Right  .  Compliance Drug Analysis, Ur  . Comp. Metabolic Panel (12)  . Magnesium  . Vitamin B12  . Sedimentation rate  . 25-Hydroxyvitamin D Lcms D2+D3  . C-reactive protein  . Ambulatory referral to Physical Therapy   Pharmacotherapy: Medications ordered:  No orders of the defined types were placed in this encounter.  Medications administered during this visit: Dymond Spreen had no medications administered during this visit.   Pharmacotherapy under consideration:  Opioid Analgesics: The patient was informed that there is no guarantee that he would be a candidate for opioid analgesics. The decision will be made following CDC guidelines. This decision will be based on the results of diagnostic studies, as well as Mr. Hagins risk profile.  Membrane stabilizer: To be determined at a later time Muscle relaxant: To be determined at a later time NSAID: To be determined at a later time Other analgesic(s): To be determined at a later time   Interventional therapies under consideration: Mr. Coate was informed that there is no guarantee that he would be a candidate for interventional therapies. The decision will be based on the results of diagnostic studies, as well as Mr. Carn risk profile.  Possible procedure(s): Diagnostic right-sided epidural steroid injection Diagnostic right-sided transforaminal epidural steroid injection Diagnostic bilateral sacroiliac joint injections Possible bilateral sacroiliac joint radiofrequency ablation Diagnostic bilateral intra-articular shoulder injection Diagnostic bilateral suprascapular nerve blocks Diagnostic bilateral  intra-articular hand joint injection Diagnostic bilateral lumbar facet nerve blocks Possible bilateral lumbar facet radiofrequency ablation   Provider-requested follow-up: Return for 2nd Visit, w/ Dr. Dossie Arbour.  Future Appointments  Date Time Provider [place]  12/25/2018 11:30 AM Milinda Pointer, MD [facility] None    Primary Care Physician: [facility] [place] Location: [facility] Outpatient Pain Management Facility Note by:  Date: 12/07/2018; Time: 2:39 PM  Pain Score Disclaimer: We use the NRS-11 scale. This is a self-reported, subjective measurement of pain severity with only modest accuracy. It is used primarily to identify changes within a particular patient. It must be understood that outpatient pain scales are significantly less accurate that those used for research, where they can be applied under ideal controlled circumstances with minimal exposure to variables. In reality, the score is likely to be a combination of pain intensity and pain affect, where pain affect describes the degree of emotional arousal or changes in action readiness caused by the sensory experience of pain. Factors such as social and work situation, setting, emotional state, anxiety levels, expectation, and prior pain experience may influence pain perception and show large inter-individual differences that may also be affected by time variables.  Patient instructions provided during this appointment: Patient Instructions   ____________________________________________________________________________________________  Appointment Policy Summary  It is our goal and responsibility to provide the medical community with assistance in the evaluation and management of patients with chronic pain. Unfortunately our resources are limited. Because we do not have an unlimited amount of time, or available appointments, we are required to closely monitor and manage their use. The following rules exist to maximize  their use:  Patient's responsibilities: 1. Punctuality:  At what time should I arrive? You should be physically present in our office 30 minutes before your scheduled appointment. Your scheduled appointment is with your assigned healthcare provider. However, it takes 5-10 minutes to be "checked-in", and another 15 minutes for the nurses to do the admission. If you arrive to our office at the time you were given for your appointment, you will end up being at least 20-25 minutes late to your  appointment with the provider. 2. Tardiness:  What happens if I arrive only a few minutes after my scheduled appointment time? You will need to reschedule your appointment. The cutoff is your appointment time. This is why it is so important that you arrive at least 30 minutes before that appointment. If you have an appointment scheduled for 10:00 AM and you arrive at 10:01, you will be required to reschedule your appointment.  3. Plan ahead:  Always assume that you will encounter traffic on your way in. Plan for it. If you are dependent on a driver, make sure they understand these rules and the need to arrive early. 4. Other appointments and responsibilities:  Avoid scheduling any other appointments before or after your pain clinic appointments.  5. Be prepared:  Write down everything that you need to discuss with your healthcare provider and give this information to the admitting nurse. Write down the medications that you will need refilled. Bring your pills and bottles (even the empty ones), to all of your appointments, except for those where a procedure is scheduled. 6. No children or pets:  Find someone to take care of them. It is not appropriate to bring them in. 7. Scheduling changes:  We request "advanced notification" of any changes or cancellations. 8. Advanced notification:  Defined as a time period of more than 24 hours prior to the originally scheduled appointment. This allows for the appointment to  be offered to other patients. 9. Rescheduling:  When a visit is rescheduled, it will require the cancellation of the original appointment. For this reason they both fall within the category of "Cancellations".  10. Cancellations:  They require advanced notification. Any cancellation less than 24 hours before the  appointment will be recorded as a "No Show". 11. No Show:  Defined as an unkept appointment where the patient failed to notify or declare to the practice their intention or inability to keep the appointment.  Corrective process for repeat offenders:  1. Tardiness: Three (3) episodes of rescheduling due to late arrivals will be recorded as one (1) "No Show". 2. Cancellation or reschedule: Three (3) cancellations or rescheduling will be recorded as one (1) "No Show". 3. "No Shows": Three (3) "No Shows" within a 12 month period will result in discharge from the practice. ____________________________________________________________________________________________   ______________________________________________________________________________________________  Specialty Pain Scale  Introduction:  There are significant differences in how pain is reported. The word pain usually refers to physical pain, but it is also a common synonym of suffering. The medical community uses a scale from 0 (zero) to 10 (ten) to report pain level. Zero (0) is described as "no pain", while ten (10) is described as "the worse pain you can imagine". The problem with this scale is that physical pain is reported along with suffering. Suffering refers to mental pain, or more often yet it refers to any unpleasant feeling, emotion or aversion associated with the perception of harm or threat of harm. It is the psychological component of pain.  Pain Specialists prefer to separate the two components. The pain scale used by this practice is the Verbal Numerical Rating Scale (VNRS-11). This scale is for the physical pain  only. DO NOT INCLUDE how your pain psychologically affects you. This scale is for adults 58 years of age and older. It has 11 (eleven) levels. The 1st level is 0/10. This means: "right now, I have no pain". In the context of pain management, it also means: "right now, my physical pain is  under control with the current therapy".  General Information:  The scale should reflect your current level of pain. Unless you are specifically asked for the level of your worst pain, or your average pain. If you are asked for one of these two, then it should be understood that it is over the past 24 hours.  Levels 1 (one) through 5 (five) are described below, and can be treated as an outpatient. Ambulatory pain management facilities such as ours are more than adequate to treat these levels. Levels 6 (six) through 10 (ten) are also described below, however, these must be treated as a hospitalized patient. While levels 6 (six) and 7 (seven) may be evaluated at an urgent care facility, levels 8 (eight) through 10 (ten) constitute medical emergencies and as such, they belong in a hospital's emergency department. When having these levels (as described below), do not come to our office. Our facility is not equipped to manage these levels. Go directly to an urgent care facility or an emergency department to be evaluated.  Definitions:  Activities of Daily Living (ADL): Activities of daily living (ADL or ADLs) is a term used in healthcare to refer to people's daily self-care activities. Health professionals often use a person's ability or inability to perform ADLs as a measurement of their functional status, particularly in regard to people post injury, with disabilities and the elderly. There are two ADL levels: Basic and Instrumental. Basic Activities of Daily Living (BADL  or BADLs) consist of self-care tasks that include: Bathing and showering; personal hygiene and grooming (including brushing/combing/styling hair); dressing;  Toilet hygiene (getting to the toilet, cleaning oneself, and getting back up); eating and self-feeding (not including cooking or chewing and swallowing); functional mobility, often referred to as "transferring", as measured by the ability to walk, get in and out of bed, and get into and out of a chair; the broader definition (moving from one place to another while performing activities) is useful for people with different physical abilities who are still able to get around independently. Basic ADLs include the things many people do when they get up in the morning and get ready to go out of the house: get out of bed, go to the toilet, bathe, dress, groom, and eat. On the average, loss of function typically follows a particular order. Hygiene is the first to go, followed by loss of toilet use and locomotion. The last to go is the ability to eat. When there is only one remaining area in which the person is independent, there is a 62.9% chance that it is eating and only a 3.5% chance that it is hygiene. Instrumental Activities of Daily Living (IADL or IADLs) are not necessary for fundamental functioning, but they let an individual live independently in a community. IADL consist of tasks that include: cleaning and maintaining the house; home establishment and maintenance; care of others (including selecting and supervising caregivers); care of pets; child rearing; managing money; managing financials (investments, etc.); meal preparation and cleanup; shopping for groceries and necessities; moving within the community; safety procedures and emergency responses; health management and maintenance (taking prescribed medications); and using the telephone or other form of communication.  Instructions:  Most patients tend to report their pain as a combination of two factors, their physical pain and their psychosocial pain. This last one is also known as "suffering" and it is reflection of how physical pain affects you  socially and psychologically. From now on, report them separately.  From this point on,  when asked to report your pain level, report only your physical pain. Use the following table for reference.  Pain Clinic Pain Levels (0-5/10)  Pain Level Score  Description  No Pain 0   Mild pain 1 Nagging, annoying, but does not interfere with basic activities of daily living (ADL). Patients are able to eat, bathe, get dressed, toileting (being able to get on and off the toilet and perform personal hygiene functions), transfer (move in and out of bed or a chair without assistance), and maintain continence (able to control bladder and bowel functions). Blood pressure and heart rate are unaffected. A normal heart rate for a healthy adult ranges from 60 to 100 bpm (beats per minute).   Mild to moderate pain 2 Noticeable and distracting. Impossible to hide from other people. More frequent flare-ups. Still possible to adapt and function close to normal. It can be very annoying and may have occasional stronger flare-ups. With discipline, patients may get used to it and adapt.   Moderate pain 3 Interferes significantly with activities of daily living (ADL). It becomes difficult to feed, bathe, get dressed, get on and off the toilet or to perform personal hygiene functions. Difficult to get in and out of bed or a chair without assistance. Very distracting. With effort, it can be ignored when deeply involved in activities.   Moderately severe pain 4 Impossible to ignore for more than a few minutes. With effort, patients may still be able to manage work or participate in some social activities. Very difficult to concentrate. Signs of autonomic nervous system discharge are evident: dilated pupils (mydriasis); mild sweating (diaphoresis); sleep interference. Heart rate becomes elevated (>115 bpm). Diastolic blood pressure (lower number) rises above 100 mmHg. Patients find relief in laying down and not moving.   Severe pain 5  Intense and extremely unpleasant. Associated with frowning face and frequent crying. Pain overwhelms the senses.  Ability to do any activity or maintain social relationships becomes significantly limited. Conversation becomes difficult. Pacing back and forth is common, as getting into a comfortable position is nearly impossible. Pain wakes you up from deep sleep. Physical signs will be obvious: pupillary dilation; increased sweating; goosebumps; brisk reflexes; cold, clammy hands and feet; nausea, vomiting or dry heaves; loss of appetite; significant sleep disturbance with inability to fall asleep or to remain asleep. When persistent, significant weight loss is observed due to the complete loss of appetite and sleep deprivation.  Blood pressure and heart rate becomes significantly elevated. Caution: If elevated blood pressure triggers a pounding headache associated with blurred vision, then the patient should immediately seek attention at an urgent or emergency care unit, as these may be signs of an impending stroke.    Emergency Department Pain Levels (6-10/10)  Emergency Room Pain 6 Severely limiting. Requires emergency care and should not be seen or managed at an outpatient pain management facility. Communication becomes difficult and requires great effort. Assistance to reach the emergency department may be required. Facial flushing and profuse sweating along with potentially dangerous increases in heart rate and blood pressure will be evident.   Distressing pain 7 Self-care is very difficult. Assistance is required to transport, or use restroom. Assistance to reach the emergency department will be required. Tasks requiring coordination, such as bathing and getting dressed become very difficult.   Disabling pain 8 Self-care is no longer possible. At this level, pain is disabling. The individual is unable to do even the most "basic" activities such as walking, eating, bathing, dressing, transferring  to a  bed, or toileting. Fine motor skills are lost. It is difficult to think clearly.   Incapacitating pain 9 Pain becomes incapacitating. Thought processing is no longer possible. Difficult to remember your own name. Control of movement and coordination are lost.   The worst pain imaginable 10 At this level, most patients pass out from pain. When this level is reached, collapse of the autonomic nervous system occurs, leading to a sudden drop in blood pressure and heart rate. This in turn results in a temporary and dramatic drop in blood flow to the brain, leading to a loss of consciousness. Fainting is one of the body's self defense mechanisms. Passing out puts the brain in a calmed state and causes it to shut down for a while, in order to begin the healing process.    Summary: 1. Refer to this scale when providing [place] with your pain level. 2. Be accurate and careful when reporting your pain level. This will help with your care. 3. Over-reporting your pain level will lead to loss of credibility. 4. Even a level of 1/10 means that there is pain and will be treated at our facility. 5. High, inaccurate reporting will be documented as "Symptom Exaggeration", leading to loss of credibility and suspicions of possible secondary gains such as obtaining more narcotics, or wanting to appear disabled, for fraudulent reasons. 6. Only pain levels of 5 or below will be seen at our facility. 7. Pain levels of 6 and above will be sent to the Emergency Department and the appointment cancelled. ______________________________________________________________________________________________

## 2018-12-08 ENCOUNTER — Encounter: Payer: Self-pay | Admitting: Nurse Practitioner

## 2018-12-08 ENCOUNTER — Other Ambulatory Visit: Payer: Self-pay | Admitting: Nurse Practitioner

## 2018-12-08 DIAGNOSIS — R700 Elevated erythrocyte sedimentation rate: Secondary | ICD-10-CM

## 2018-12-08 DIAGNOSIS — E559 Vitamin D deficiency, unspecified: Secondary | ICD-10-CM | POA: Insufficient documentation

## 2018-12-08 DIAGNOSIS — R7982 Elevated C-reactive protein (CRP): Secondary | ICD-10-CM | POA: Insufficient documentation

## 2018-12-08 LAB — VITAMIN D 25 HYDROXY (VIT D DEFICIENCY, FRACTURES): Vit D, 25-Hydroxy: 13.7 ng/mL — ABNORMAL LOW (ref 30.0–100.0)

## 2018-12-08 NOTE — Progress Notes (Signed)
Results were reviewed and found to be: mildly abnormal  No acute injury or pathology identified  Review would suggest interventional pain management techniques may be of benefit 

## 2018-12-08 NOTE — Progress Notes (Signed)
Results were reviewed and found to be: abnormal  No acute injury or pathology identified  Review would suggest interventional pain management techniques may be of benefit

## 2018-12-14 LAB — COMPLIANCE DRUG ANALYSIS, UR

## 2018-12-18 ENCOUNTER — Encounter: Payer: Self-pay | Admitting: Nurse Practitioner

## 2018-12-18 DIAGNOSIS — F1290 Cannabis use, unspecified, uncomplicated: Secondary | ICD-10-CM | POA: Insufficient documentation

## 2018-12-18 DIAGNOSIS — F1490 Cocaine use, unspecified, uncomplicated: Secondary | ICD-10-CM | POA: Insufficient documentation

## 2018-12-25 ENCOUNTER — Ambulatory Visit (HOSPITAL_BASED_OUTPATIENT_CLINIC_OR_DEPARTMENT_OTHER): Payer: Medicare Other | Admitting: Pain Medicine

## 2018-12-25 ENCOUNTER — Ambulatory Visit
Admission: RE | Admit: 2018-12-25 | Discharge: 2018-12-25 | Disposition: A | Discharge status: Home / Self Care | Payer: Medicare Other | Source: Ambulatory Visit | Attending: Pain Medicine | Admitting: Pain Medicine

## 2018-12-25 ENCOUNTER — Encounter: Payer: Self-pay | Admitting: Pain Medicine

## 2018-12-25 ENCOUNTER — Other Ambulatory Visit: Payer: Self-pay

## 2018-12-25 VITALS — BP 142/67 | HR 70 | Temp 98.0°F | Resp 16 | Ht 68.0 in | Wt 167.0 lb

## 2018-12-25 DIAGNOSIS — M25552 Pain in left hip: Secondary | ICD-10-CM | POA: Insufficient documentation

## 2018-12-25 DIAGNOSIS — M65312 Trigger thumb, left thumb: Secondary | ICD-10-CM

## 2018-12-25 DIAGNOSIS — M5441 Lumbago with sciatica, right side: Secondary | ICD-10-CM | POA: Insufficient documentation

## 2018-12-25 DIAGNOSIS — M25551 Pain in right hip: Secondary | ICD-10-CM | POA: Insufficient documentation

## 2018-12-25 DIAGNOSIS — E559 Vitamin D deficiency, unspecified: Secondary | ICD-10-CM

## 2018-12-25 DIAGNOSIS — F1490 Cocaine use, unspecified, uncomplicated: Secondary | ICD-10-CM

## 2018-12-25 DIAGNOSIS — M79601 Pain in right arm: Secondary | ICD-10-CM

## 2018-12-25 DIAGNOSIS — G8929 Other chronic pain: Secondary | ICD-10-CM | POA: Insufficient documentation

## 2018-12-25 DIAGNOSIS — M51369 Other intervertebral disc degeneration, lumbar region without mention of lumbar back pain or lower extremity pain: Secondary | ICD-10-CM

## 2018-12-25 DIAGNOSIS — M5136 Other intervertebral disc degeneration, lumbar region: Secondary | ICD-10-CM | POA: Insufficient documentation

## 2018-12-25 DIAGNOSIS — M25511 Pain in right shoulder: Secondary | ICD-10-CM

## 2018-12-25 DIAGNOSIS — M79602 Pain in left arm: Secondary | ICD-10-CM | POA: Insufficient documentation

## 2018-12-25 DIAGNOSIS — M25512 Pain in left shoulder: Secondary | ICD-10-CM | POA: Insufficient documentation

## 2018-12-25 DIAGNOSIS — M65311 Trigger thumb, right thumb: Secondary | ICD-10-CM | POA: Insufficient documentation

## 2018-12-25 DIAGNOSIS — Z720 Tobacco use: Secondary | ICD-10-CM

## 2018-12-25 DIAGNOSIS — M4696 Unspecified inflammatory spondylopathy, lumbar region: Secondary | ICD-10-CM | POA: Insufficient documentation

## 2018-12-25 DIAGNOSIS — Z8661 Personal history of infections of the central nervous system: Secondary | ICD-10-CM | POA: Insufficient documentation

## 2018-12-25 DIAGNOSIS — G894 Chronic pain syndrome: Secondary | ICD-10-CM

## 2018-12-25 DIAGNOSIS — R1031 Right lower quadrant pain: Secondary | ICD-10-CM

## 2018-12-25 DIAGNOSIS — Z8739 Personal history of other diseases of the musculoskeletal system and connective tissue: Secondary | ICD-10-CM

## 2018-12-25 DIAGNOSIS — R700 Elevated erythrocyte sedimentation rate: Secondary | ICD-10-CM

## 2018-12-25 DIAGNOSIS — M961 Postlaminectomy syndrome, not elsewhere classified: Secondary | ICD-10-CM | POA: Insufficient documentation

## 2018-12-25 DIAGNOSIS — R7982 Elevated C-reactive protein (CRP): Secondary | ICD-10-CM

## 2018-12-25 DIAGNOSIS — F1290 Cannabis use, unspecified, uncomplicated: Secondary | ICD-10-CM | POA: Insufficient documentation

## 2018-12-25 DIAGNOSIS — M79604 Pain in right leg: Secondary | ICD-10-CM

## 2018-12-25 MED ORDER — ERGOCALCIFEROL 1.25 MG (50000 UT) PO CAPS: 50000.0000 [IU] | ORAL_CAPSULE | ORAL | 0 refills | Status: AC

## 2018-12-25 MED ORDER — VITAMIN D3 125 MCG (5000 UT) PO CAPS: 1.0000 | ORAL_CAPSULE | Freq: Every day | ORAL | 5 refills | Status: AC

## 2018-12-25 MED ORDER — CALCIUM CARBONATE 600 MG PO TABS: 600.0000 mg | ORAL_TABLET | Freq: Two times a day (BID) | ORAL | 0 refills | Status: AC

## 2018-12-25 MED ORDER — MAGNESIUM 500 MG PO CAPS: 500.0000 mg | ORAL_CAPSULE | Freq: Two times a day (BID) | ORAL | 5 refills | Status: AC

## 2018-12-25 NOTE — Progress Notes (Signed)
Patient's Name: Arthur Wilcox  MRN: [id number]  Referring Provider: [facility], [place]: 1950/12/10  PCP: [facility], [place]: 12/25/2018  Note by: Gaspar Cola, MD  Service setting: Ambulatory outpatient  Specialty: Interventional Pain Management  Location: [facility] (AMB) Pain Management Facility    Patient type: Established   Primary Reason(s) for Visit: Encounter for evaluation before starting new chronic pain management plan of care (Level of risk: moderate) CC: Back Pain (lower); Hand Pain (bilateral); and Shoulder Pain (bilateral)  HPI  Arthur Wilcox is a 68 y.o. year old, male patient, who comes today for a follow-up evaluation to review the test results and decide on a treatment plan. He has Preoperative evaluation to rule out surgical contraindication; Abnormal ECG; Essential hypertension, benign; Shortness of breath; Tobacco abuse; Herniation of cervical intervertebral disc with radiculopathy; Lumbar stenosis with neurogenic claudication; Wound infection after surgery; Chronic low back pain (Primary Area of Pain) (Bilateral) (R>L) w/ sciatica (Right); Chronic lower extremity pain (Secondary Area of Pain) (Right); Chronic shoulder pain (Tertiary Area of Pain) (Bilateral) (R>L); Hand pain (Bilateral) (R>L); Chronic neck pain (Bilateral) (R>L); Chronic pain syndrome; Pharmacologic therapy; Disorder of skeletal system; Problems influencing health status; Chronic sacroiliac joint pain (Bilateral) (R>L); Elevated C-reactive protein (CRP); Elevated sed rate; Vitamin D deficiency; Cocaine use; Marijuana use; Chronic upper extremity pain (Fourth Area of Pain) (Bilateral) (R>L); Failed back surgical syndrome; History of osteomyelitis of the Lumbar Spine; History of CNS infection (Epidural Abscess); 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 His primarily concern today is the Back Pain (lower); Hand Pain (bilateral); and Shoulder Pain (bilateral)  Pain Assessment: Location: Lower Back Radiating: groin,both legs to the bottom of the heel, sometimes only to the knee Onset: More than a month ago Duration: Chronic pain Quality: Aching, Dull Severity: 8 /10 (subjective, self-reported pain score)  Note: Reported level is compatible with observation.                         When using our objective Pain Scale, levels between 6 and 10/10 are said to belong in an emergency room, as it progressively worsens from a 6/10, described as severely limiting, requiring emergency care not usually available at an outpatient pain management facility. At a 6/10 level, communication becomes difficult and requires great effort. Assistance to reach the emergency department may be required. Facial flushing and profuse sweating along with potentially dangerous increases in heart rate and blood pressure will be evident. Timing: Constant Modifying factors: nothing BP: (!) 142/67  HR: 70  Arthur Wilcox comes in today for a follow-up visit after his initial evaluation on 12/07/2018. Today we went over the results of his tests. These were explained in "Layman's terms". During today's appointment we went over my diagnostic impression, as well as the proposed treatment plan.  He admits that his primary area of pain is in his lower back(B)(R>L).  The pain varies but the right is greater than the left.  He is s/p lumbar spinal surgery with Dr. Trenton Gammon 2013.  He did have a to have a revision, due to infection (Ventral & Dorsal Epidural Abscess & Osteomyelitis).  He admits that he did have injections prior to the surgery with [facility] (2011-12), however they were not effective at that time.  [facility] been [facility] and a Chiropractor. PT before surgery  did not help. He denies any recent physical therapy or recent images. Released after 6 months of  antibiotics via PICC line.    His second area of pain is in his legs(B)(R>L).  He admits that the pain from the back radiates down the right side into his groin and the inner part of his thigh.  He has occasional numbness tingling. RLEP - intermittent, down to heel thru the back of leg. 90% of the time it is in his groin. LLEP - intermittent, down to back of knee, thru back of leg.  His third area pain is in his shoulders(B)(R>L).  He admits the right is greater than the left.  He denies any previous surgery.  He did have injections at [facility], right [phone]) greater than 10 years ago.  He denies any recent images. He indicates having had cervical spine surgery (2013 by Dr. Deri Fuelling).   His fourth area of pain is in his upper extremities(B)(R>L).  Mostly shoulder pain. Hands swell. He has numbness tingling and weakness in his arms that goes into the hands.  He admits that his thumbs lock up on occasion.  He is currently taking meloxicam however this is not effective.  He states that he does have some occasional neck pain(B)(R>L).  The right side being greater than the left.  He states that he did have surgery on his neck in the past.  He denies any recent physical therapy or images   In considering the treatment plan options, Arthur Wilcox was reminded that I no longer take patients for medication management only. I asked him to let me know if he had no intention of taking advantage of the interventional therapies, so that we could make arrangements to provide this space to someone interested. I also made it clear that undergoing interventional therapies for the purpose of getting pain medications is very inappropriate on the part of a patient, and it will not be tolerated in this practice. This type of behavior would suggest true addiction and therefore it requires referral to an addiction specialist.   Further details on both, my assessment(s), as well as the proposed treatment plan, please  see below.  Controlled Substance Pharmacotherapy Assessment REMS (Risk Evaluation and Mitigation Strategy)  Analgesic: None  Pill Count: None expected due to no prior prescriptions written by our practice. Landis Martins, RN  12/25/2018 10:44 AM  Sign when Signing Visit Safety precautions to be maintained throughout the outpatient stay will include: orient to surroundings, keep bed in low position, maintain call bell within reach at all times, provide assistance with transfer out of bed and ambulation.    Pharmacokinetics: N/A Pharmacodynamics: N/A Monitoring: [place] PMP: Online review of the past 70-monthperiod previously conducted. Not applicable at this point since we have not taken over the patient's medication management yet. List of other Serum/Urine Drug Screening Test(s):  No results found. List of all UDS test(s) done:  Lab Results  Component Value Date   SUMMARY FINAL 12/07/2018   Last UDS on record: Summary  Date Value Ref Range Status  12/07/2018 FINAL  Final    Comment:    ==================================================================== TOXASSURE COMP DRUG ANALYSIS,UR ==================================================================== Test                             Result       Flag       Units Drug Present and Declared for Prescription Verification   Desmethylcyclobenzaprine  PRESENT      EXPECTED    Desmethylcyclobenzaprine is an expected metabolite of    cyclobenzaprine. Drug Present not Declared for Prescription Verification   Benzoylecgonine                >2618        UNEXPECTED ng/mg creat    Benzoylecgonine is a metabolite of cocaine; its presence    indicates use of this drug.  Source is most commonly illicit, but    cocaine is present in some topical anesthetic solutions.   Carboxy-THC                    7            UNEXPECTED ng/mg creat    Carboxy-THC is a metabolite of tetrahydrocannabinol  (THC).    Source of [facility] is most commonly  illicit, but THC is also present    in a scheduled prescription medication. ==================================================================== Test                      Result    Flag   Units      Ref Range   Creatinine              191              mg/dL      >=20 ==================================================================== Declared Medications:  The flagging and interpretation on this report are based on the  following declared medications.  Unexpected results may arise from  inaccuracies in the declared medications.  **Note: The testing scope of this panel includes these medications:  Cyclobenzaprine (Flexeril)  **Note: The testing scope of this panel does not include following  reported medications:  Atorvastatin (Lipitor)  Meloxicam (Mobic)  Sildenafil (Viagra) ==================================================================== For clinical consultation, please call [phone]. ====================================================================    UDS interpretation: Unexpected findings: Undeclared illicit substance detected Medication Assessment Form: Not applicable. No opioids. Treatment compliance: Not applicable Risk Assessment Profile: Aberrant behavior: use of illicit substances Comorbid factors increasing risk of overdose: history of substance abuse, history of substance use disorder, male gender and nicotine dependence Opioid risk tool (ORT) (Total Score): 0 Personal History of Substance Abuse (SUD-Substance use disorder):  Alcohol: Negative  Illegal Drugs: Negative  Rx Drugs: Negative  ORT Risk Level calculation: Low Risk Risk of substance use disorder (SUD): Very High Opioid Risk Tool - 12/25/18 1044      Family History of Substance Abuse   Alcohol  Negative    Illegal Drugs  Negative    Rx Drugs  Negative      Personal History of Substance Abuse   Alcohol  Negative    Illegal Drugs  Negative    Rx Drugs  Negative      Age   Age between  25-45 years   No      History of Preadolescent Sexual Abuse   History of Preadolescent Sexual Abuse  Negative or Male      Psychological Disease   Psychological Disease  Negative    Depression  Negative      Total Score   Opioid Risk Tool Scoring  0    Opioid Risk Interpretation  Low Risk      ORT Scoring interpretation table:  Score <3 = Low Risk for SUD  Score between 4-7 = Moderate Risk for SUD  Score >8 = High Risk for Opioid Abuse   Risk Mitigation Strategies:  Patient opioid safety counseling: No  controlled substances prescribed. Patient-Prescriber Agreement (PPA): No agreement signed.  Controlled substance notification to other providers: None required. No opioid therapy.  Pharmacologic Plan: Arthur Wilcox is not a candidate for opioid therapy at this time.             Laboratory Chemistry  Inflammation Markers (CRP: Acute Phase) (ESR: Chronic Phase) Lab Results  Component Value Date   CRP 3.4 (H) 12/07/2018   ESRSEDRATE 32 (H) 12/07/2018                         Rheumatology Markers No results found.  Renal Function Markers Lab Results  Component Value Date   BUN 16 12/07/2018   CREATININE 1.09 12/07/2018   GFRAA >60 12/07/2018   GFRNONAA >60 12/07/2018                             Hepatic Function Markers Lab Results  Component Value Date   AST 24 12/07/2018   ALT 16 12/07/2018   ALBUMIN 4.4 12/07/2018   ALKPHOS 54 12/07/2018                        Electrolytes Lab Results  Component Value Date   NA 138 12/07/2018   K 3.6 12/07/2018   CL 103 12/07/2018   CALCIUM 9.1 12/07/2018   MG 2.1 12/07/2018                        Neuropathy Markers Lab Results  Component Value Date   VITAMINB12 417 12/07/2018                        CNS Tests No results found.  Bone Pathology Markers Lab Results  Component Value Date   VD25OH 13.7 (L) 12/07/2018                         Coagulation Parameters Lab Results  Component Value Date   INR 1.0  06/18/2012   LABPROT 13.2 06/18/2012   PLT 206 05/22/2015                        Cardiovascular Markers Lab Results  Component Value Date   HGB 12.8 (L) 05/22/2015   HCT 39.7 (L) 05/22/2015                         CA Markers No results found.  Note: Lab results reviewed.  Recent Diagnostic Imaging Review  Cervical Imaging: Cervical DG 1 view:  Results for orders placed during the hospital encounter of 04/18/12  DG Cervical Spine 1 View   Narrative *RADIOLOGY REPORT*  Clinical Data: For cervical laminectomy  DG CERVICAL SPINE - 1 VIEW  Comparison: None.  Findings: The lower cervical spine is difficult to assess on the portable film obtained.  The instrument for localization is positioned posteriorly directed probably toward the C 7-T1 level although the film is too light in that region to adequately assess. There are degenerative changes from C4-C7 noted with loss of disc space and spurring.  IMPRESSION: The film is inadequate for localization of the instrument positioned posteriorly.  Original Report Authenticated By: Joretta Bachelor, M.D.   Cervical DG Bending/F/E views:  Results for orders placed during the hospital encounter of 12/07/18  DG  Cervical Spine With Flex & Extend   Narrative CLINICAL DATA:  Pt reports pain in his lower back, neck, bilateral shoulders, and bilateral hands.  Hx osteoarthritis, joint disease  EXAM: CERVICAL SPINE COMPLETE WITH FLEXION AND EXTENSION VIEWS  COMPARISON:  04/18/2012 and MRI, 01/13/2012.  FINDINGS: No fracture, bone lesion or spondylolisthesis.  Mild loss of disc height at C3-C4 with moderate loss of disc height from C4-C5 through C6-C7. There are endplate osteophytes at these levels with uncovertebral spurring causing at least mild degrees of neural foraminal narrowing, greatest bilaterally at C5-C6.  Soft tissues are unremarkable.  Flexion and extension views show no subluxation.  IMPRESSION: 1. No fracture,  spondylolisthesis or acute finding. 2. Disc degenerative changes from C3-C4 through C6-C7, which have mildly progressed compared to the prior exam. 3. No subluxation with flexion or extension.   Electronically Signed   By: Lajean Manes M.D.   On: 12/07/2018 14:52    Shoulder Imaging: Shoulder-R DG:  Results for orders placed during the hospital encounter of 12/07/18  DG Shoulder Right   Narrative CLINICAL DATA:  Pt reports pain in his lower back, neck, bilateral shoulders, and bilateral hands.  Hx osteoarthritis, joint disease  EXAM: RIGHT SHOULDER - 2+ VIEW  COMPARISON:  None.  FINDINGS: No fracture or bone lesion. Glenohumeral joint normally spaced and aligned. No arthropathic changes.  Minor AC joint narrowing with small marginal osteophytes.  Soft tissues are unremarkable.  IMPRESSION: 1. No fracture, bone lesion or glenohumeral joint abnormality. 2. Minor AC joint osteoarthritis.   Electronically Signed   By: Lajean Manes M.D.   On: 12/07/2018 14:55    Shoulder-L DG:  Results for orders placed during the hospital encounter of 12/07/18  DG Shoulder Left   Narrative CLINICAL DATA:  Pt reports pain in his lower back, neck, bilateral shoulders, and bilateral hands.  Hx osteoarthritis, joint disease  EXAM: LEFT SHOULDER - 2+ VIEW  COMPARISON:  None.  FINDINGS: No fracture or bone lesion.  Glenohumeral joint normally spaced and aligned. There is narrowing of the [facility] joint with small marginal osteophytes consistent with mild osteoarthritis.  Soft tissues are unremarkable.  IMPRESSION: 1. No fracture, bone lesion or glenohumeral joint abnormality. 2. Mild AC joint osteoarthritis.   Electronically Signed   By: Lajean Manes M.D.   On: 12/07/2018 14:54    Lumbosacral Imaging: Lumbar MR w/wo contrast:  Results for orders placed during the hospital encounter of 06/19/12  MR Lumbar Spine W Wo Contrast   Narrative *RADIOLOGY REPORT*  Clinical  Data: 68 year old male with severe pain.  Lumbar surgery 3 weeks ago with drainage from the incision site.  MRI LUMBAR SPINE WITHOUT AND WITH CONTRAST  Technique:  Multiplanar and multiecho pulse sequences of the lumbar spine were obtained without and with intravenous contrast.  Contrast: 89m MULTIHANCE GADOBENATE DIMEGLUMINE 529 MG/ML IV SOLN  Comparison: Intraoperative lumbar radiograph 05/29/2012.  CT abdomen pelvis 06/17/2012.  Findings: Postoperative changes posteriorly with a subcutaneous fluid collection extending from L2-L3 nearly to L5-L1 encompassing 6 x 3.4 x 6.6 cm (AP by transverse by CC).  Moderate to intense posterior paraspinal muscle enhancement surrounding the collection which tracks to the thecal sac at L3-L4 and L4-L5.  There is abnormal dural thickening and enhancement extending from L2 to S1.  There is evidence of epidural or subdural component to the collection with ventral displacement of nerve roots at L2-L3 (series 8 image 7, series 5 image 7).  There is a similar sized ventral epidural rim enhancing  collection at the L4 vertebral body level.  Combined, there is severe spinal stenosis from L3-L4 to L4- L5, with severe right lateral recess stenosis at the latter.  Furthermore, there is abnormal marrow signal within the L3, L4, and L5 spinous processes.  The previously described fluid collection may also communicate with the bilateral L3-L4 facet joints which appear abnormal.   Visualized lower thoracic spinal cord is normal with conus medularis at L2.  No psoas muscle inflammation.  No prevertebral inflammation. Negative visualized abdominal viscera.  IMPRESSION: Constellation of findings compatible with acute spinal infection including ventral and dorsal epidural abscesses, posterior element osteomyelitis, and posterior paraspinal myositis from L2-L3 to L5- S1 as detailed above.  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  I reached  Dr. Annette Stable via an OR tech at the 0820 hours on 06/19/2012. He related that he had seen this exam, and that repeat surgery on this patient is pending this morning.  I advised him to call me with any questions he may have regarding the above report.  Original Report Authenticated By: Randall An, M.D.   Lumbar DG 1V:  Results for orders placed during the hospital encounter of 05/29/12  DG Lumbar Spine 1 View   Narrative *RADIOLOGY REPORT*  Clinical Data: L3-L5 laminectomy and discectomy.  LUMBAR SPINE - 1 VIEW  Comparison: None.  Findings: Single image shows tissue spreaders posteriorly with a probe between the spinous processes of L3 and L4, directed at the pedicle level of L4.  IMPRESSION: Pedicle level L4 localized.  Original Report Authenticated By: Jules Schick, M.D.   Lumbar DG Bending views:  Results for orders placed during the hospital encounter of 12/07/18  DG Lumbar Spine Complete W/Bend   Narrative CLINICAL DATA:  Arthralgias in multiple areas including the low back. History of osteoarthritis.  EXAM: LUMBAR SPINE - COMPLETE WITH BENDING VIEWS  COMPARISON:  Coronal and sagittal reconstructed images through the lumbar spine from an abdominal and pelvic CT scan dated November 13, 2018  FINDINGS: The lumbar vertebral bodies are preserved in height. There is moderate disc space narrowing at L3-4 with milder narrowing at L4-5. There is no spondylolisthesis. There are anterior and lateral endplate osteophytes in the mid and lower lumbar spine. There is no pars defect. There is facet joint hypertrophy from L3-4 through L5-S1. The pedicles and transverse processes are intact.  IMPRESSION: Moderate degenerative disc disease at L3-4 and L4-5 with facet joint degenerative change from L3 inferiorly. No compression fracture or spondylolisthesis   Electronically Signed   By: David  Martinique M.D.   On: 12/07/2018 14:50    Sacroiliac Joint Imaging: Sacroiliac  Joint DG:  Results for orders placed during the hospital encounter of 12/07/18  DG Si Joints   Narrative CLINICAL DATA:  Pt reports pain in his lower back, neck, bilateral shoulders, and bilateral hands.  Hx osteoarthritis, joint disease  EXAM: BILATERAL SACROILIAC JOINTS - 3+ VIEW  COMPARISON:  CT, 11/13/2018  FINDINGS: The sacroiliac joint spaces are maintained and there is no evidence of arthropathy. No other bone abnormalities are seen.  IMPRESSION: Negative.   Electronically Signed   By: Lajean Manes M.D.   On: 12/07/2018 14:50    Hand Imaging: Hand-R DG Complete:  Results for orders placed during the hospital encounter of 12/07/18  DG Hand Complete Right   Narrative CLINICAL DATA:  Pt reports pain in his lower back, neck, bilateral shoulders, and bilateral hands.  Hx osteoarthritis, joint disease  EXAM: RIGHT HAND -  COMPLETE 3+ VIEW  COMPARISON:  None.  FINDINGS: There is no evidence of fracture or dislocation. There is no evidence of arthropathy or other focal bone abnormality. Soft tissues are unremarkable.  IMPRESSION: Negative.   Electronically Signed   By: Lajean Manes M.D.   On: 12/07/2018 14:55    Hand-L DG Complete:  Results for orders placed during the hospital encounter of 12/07/18  DG Hand Complete Left   Narrative CLINICAL DATA:  Pt reports pain in his lower back, neck, bilateral shoulders, and bilateral hands.  Hx osteoarthritis, joint disease  EXAM: LEFT HAND - COMPLETE 3+ VIEW  COMPARISON:  None.  FINDINGS: There is no evidence of fracture or dislocation. There is no evidence of arthropathy or other focal bone abnormality. Soft tissues are unremarkable.  IMPRESSION: Negative.   Electronically Signed   By: Lajean Manes M.D.   On: 12/07/2018 14:53    Complexity Note: Imaging results reviewed. Results shared with Arthur Wilcox, using Layman's terms.                         Meds   Current Outpatient Medications:  .   atorvastatin (LIPITOR) 40 MG tablet, Take 40 mg by mouth daily., Disp: , Rfl:  .  cyclobenzaprine (FLEXERIL) 10 MG tablet, Take 10 mg by mouth at bedtime as needed. For pain, Disp: , Rfl:  .  meloxicam (MOBIC) 15 MG tablet, Take 15 mg by mouth daily., Disp: , Rfl:  .  naproxen sodium (ALEVE) 220 MG tablet, Take 220 mg by mouth daily as needed., Disp: , Rfl:  .  sildenafil (VIAGRA) 100 MG tablet, Take 100 mg by mouth daily as needed for erectile dysfunction., Disp: , Rfl:  .  calcium carbonate (CALCIUM 600) 600 MG TABS tablet, Take 1 tablet (600 mg total) by mouth 2 (two) times daily with a meal for 30 days., Disp: 60 tablet, Rfl: 0 .  Cholecalciferol (VITAMIN D3) 125 MCG (5000 UT) CAPS, Take 1 capsule (5,000 Units total) by mouth daily with breakfast. Take along with calcium and magnesium., Disp: 30 capsule, Rfl: 5 .  ergocalciferol (VITAMIN D2) 1.25 MG (50000 UT) capsule, Take 1 capsule (50,000 Units total) by mouth 2 (two) times a week. X 6 weeks., Disp: 12 capsule, Rfl: 0 .  Magnesium 500 MG CAPS, Take 1 capsule (500 mg total) by mouth 2 (two) times daily at 8 am and 10 pm., Disp: 60 capsule, Rfl: 5  ROS  Constitutional: Denies any fever or chills Gastrointestinal: No reported hemesis, hematochezia, vomiting, or acute GI distress Musculoskeletal: Denies any acute onset joint swelling, redness, loss of ROM, or weakness Neurological: No reported episodes of acute onset apraxia, aphasia, dysarthria, agnosia, amnesia, paralysis, loss of coordination, or loss of consciousness  Allergies  Arthur Wilcox has No Known Allergies.  PFSH  Drug: Arthur Wilcox  reports current drug use. Drug: Marijuana. Alcohol:  reports current alcohol use of about 1.0 standard drinks of alcohol per week. Tobacco:  reports that he has been smoking cigarettes and cigars. He has a 22.00 pack-year smoking history. He has never used smokeless tobacco. Medical:  has a past medical history of Anxiety, Chronic back pain, Chronic back  pain, Chronic neck pain, Depression, Essential hypertension, benign, Headache(784.0), History of foot fracture, Hyperlipidemia, Joint disease, Migraine, Osteoarthritis, Seizures ([place]), and Shortness of breath. Surgical: Arthur Wilcox  has a past surgical history that includes Posterior cervical laminectomy (04/18/2012); Lumbar laminectomy/decompression microdiscectomy (05/29/2012); and Lumbar wound debridement (06/19/2012).  Family: family history includes Coronary artery disease in his brother; Diabetes type II in his brother and sister; Hypertension in his mother; Stroke in his mother.  Constitutional Exam  General appearance: Well nourished, well developed, and well hydrated. In no apparent acute distress Vitals:   12/25/18 1040  BP: (!) 142/67  Pulse: 70  Resp: 16  Temp: 98 F (36.7 C)  TempSrc: Oral  SpO2: 100%  Weight: 167 lb (75.8 kg)  Height: _0  (1.727 m)   BMI Assessment: Estimated body mass index is 25.39 kg/m as calculated from the following:   Height as of this encounter: _1  (1.727 m).   Weight as of this encounter: 167 lb (75.8 kg).  BMI interpretation table: BMI level Category Range association with higher incidence of chronic pain  <18 kg/m2 Underweight   18.5-24.9 kg/m2 Ideal body weight   25-29.9 kg/m2 Overweight Increased incidence by 20%  30-34.9 kg/m2 Obese (Class I) Increased incidence by 68%  35-39.9 kg/m2 Severe obesity (Class II) Increased incidence by 136%  >40 kg/m2 Extreme obesity (Class III) Increased incidence by 254%   Patient's current BMI Ideal Body weight  Body mass index is 25.39 kg/m. Ideal body weight: 68.4 kg (150 lb 12.7 oz) Adjusted ideal body weight: 71.3 kg (157 lb 4.4 oz)   BMI Readings from Last 4 Encounters:  12/25/18 25.39 kg/m  12/07/18 25.39 kg/m  05/22/15 22.81 kg/m  05/21/15 22.81 kg/m   Wt Readings from Last 4 Encounters:  12/25/18 167 lb (75.8 kg)  12/07/18 167 lb (75.8 kg)  05/22/15 150 lb (68 kg)  05/21/15 150 lb (68  kg)  Psych/Mental status: Alert, oriented x 3 (person, place, & time)       Eyes: PERLA Respiratory: No evidence of acute respiratory distress  Cervical Spine Area Exam  Skin & Axial Inspection: No masses, redness, edema, swelling, or associated skin lesions Alignment: Symmetrical Functional ROM: Unrestricted ROM      Stability: No instability detected Muscle Tone/Strength: Functionally intact. No obvious neuro-muscular anomalies detected. Sensory (Neurological): Unimpaired Palpation: No palpable anomalies              Upper Extremity (UE) Exam    Side: Right upper extremity  Side: Left upper extremity  Skin & Extremity Inspection: Skin color, temperature, and hair growth are WNL. No peripheral edema or cyanosis. No masses, redness, swelling, asymmetry, or associated skin lesions. No contractures.  Skin & Extremity Inspection: Skin color, temperature, and hair growth are WNL. No peripheral edema or cyanosis. No masses, redness, swelling, asymmetry, or associated skin lesions. No contractures.  Functional ROM: Unrestricted ROM          Functional ROM: Unrestricted ROM          Muscle Tone/Strength: Functionally intact. No obvious neuro-muscular anomalies detected.  Muscle Tone/Strength: Functionally intact. No obvious neuro-muscular anomalies detected.  Sensory (Neurological): Unimpaired          Sensory (Neurological): Unimpaired          Palpation: No palpable anomalies              Palpation: No palpable anomalies              Provocative Test(s):  Phalen's test: deferred Tinel's test: deferred Apley's scratch test (touch opposite shoulder):  Action 1 (Across chest): deferred Action 2 (Overhead): deferred Action 3 (LB reach): deferred   Provocative Test(s):  Phalen's test: deferred Tinel's test: deferred Apley's scratch test (touch opposite shoulder):  Action 1 (Across chest): deferred  Action 2 (Overhead): deferred Action 3 (LB reach): deferred    Thoracic Spine Area Exam   Skin & Axial Inspection: No masses, redness, or swelling Alignment: Symmetrical Functional ROM: Unrestricted ROM Stability: No instability detected Muscle Tone/Strength: Functionally intact. No obvious neuro-muscular anomalies detected. Sensory (Neurological): Unimpaired Muscle strength & Tone: No palpable anomalies  Lumbar Spine Area Exam  Skin & Axial Inspection: No masses, redness, or swelling Alignment: Symmetrical Functional ROM: Unrestricted ROM       Stability: No instability detected Muscle Tone/Strength: Functionally intact. No obvious neuro-muscular anomalies detected. Sensory (Neurological): Unimpaired Palpation: No palpable anomalies       Provocative Tests: Hyperextension/rotation test: deferred today       Lumbar quadrant test (Kemp's test): deferred today       Lateral bending test: deferred today       Patrick's Maneuver: deferred today                   FABER* test: deferred today                   S-I anterior distraction/compression test: deferred today         S-I lateral compression test: deferred today         S-I Thigh-thrust test: deferred today         S-I Gaenslen's test: deferred today         *(Flexion, ABduction and External Rotation)  Gait & Posture Assessment  Ambulation: Patient ambulates using a cane Gait: Limited. Using assistive device to ambulate Posture: WNL   Lower Extremity Exam    Side: Right lower extremity  Side: Left lower extremity  Stability: No instability observed          Stability: No instability observed          Skin & Extremity Inspection: Skin color, temperature, and hair growth are WNL. No peripheral edema or cyanosis. No masses, redness, swelling, asymmetry, or associated skin lesions. No contractures.  Skin & Extremity Inspection: Skin color, temperature, and hair growth are WNL. No peripheral edema or cyanosis. No masses, redness, swelling, asymmetry, or associated skin lesions. No contractures.  Functional ROM: Limited  ROM                  Functional ROM: Unrestricted ROM                  Muscle Tone/Strength: Able to Toe-walk & Heel-walk without problems  Muscle Tone/Strength: Able to Toe-walk & Heel-walk without problems  Sensory (Neurological): Unimpaired        Sensory (Neurological): Unimpaired        DTR: Patellar: deferred today Achilles: deferred today Plantar: deferred today  DTR: Patellar: deferred today Achilles: deferred today Plantar: deferred today  Palpation: No palpable anomalies  Palpation: No palpable anomalies   Assessment & Plan  Primary Diagnosis & Pertinent Problem List: The primary encounter diagnosis was Chronic pain syndrome. Diagnoses of Chronic low back pain (Primary Area of Pain) (Bilateral) (R>L) w/ sciatica (Right), Chronic lower extremity pain (Secondary Area of Pain) (Right), Chronic shoulder pain (Tertiary Area of Pain) (Bilateral) (R>L), Chronic upper extremity pain (Fourth Area of Pain) (Bilateral) (R>L), Failed back surgical syndrome, History of osteomyelitis of the Lumbar Spine, History of CNS infection (Epidural Abscess), Elevated sed rate, Elevated C-reactive protein (CRP), Marijuana use, Cocaine use, Tobacco abuse, Vitamin D deficiency, Other intervertebral disc degeneration, lumbar region, Lumbar spondylitis (HCC), Trigger thumb of both thumbs, Chronic hip pain (Bilateral (R>L),  and Chronic groin pain (Right) were also pertinent to this visit.  Visit Diagnosis: 1. Chronic pain syndrome   2. Chronic low back pain (Primary Area of Pain) (Bilateral) (R>L) w/ sciatica (Right)   3. Chronic lower extremity pain (Secondary Area of Pain) (Right)   4. Chronic shoulder pain (Tertiary Area of Pain) (Bilateral) (R>L)   5. Chronic upper extremity pain (Fourth Area of Pain) (Bilateral) (R>L)   6. Failed back surgical syndrome   7. History of osteomyelitis of the Lumbar Spine   8. History of CNS infection (Epidural Abscess)   9. Elevated sed rate   10. Elevated C-reactive  protein (CRP)   11. Marijuana use   12. Cocaine use   13. Tobacco abuse   14. Vitamin D deficiency   15. Other intervertebral disc degeneration, lumbar region   16. Lumbar spondylitis ([place])   17. Trigger thumb of both thumbs   18. Chronic hip pain (Bilateral (R>L)   19. Chronic groin pain (Right)    Problems updated and reviewed during this visit: Problem  Chronic upper extremity pain (Fourth Area of Pain) (Bilateral) (R>L)  Failed Back Surgical Syndrome  History of osteomyelitis of the Lumbar Spine  History of CNS infection (Epidural Abscess)  Lumbar Spondylitis (Hcc)  Trigger thumb (Bilateral)   Since 2019   Other Intervertebral Disc Degeneration, Lumbar Region  Chronic hip pain (Bilateral (R>L)  Chronic groin pain (Right)  Chronic low back pain (Primary Area of Pain) (Bilateral) (R>L) w/ sciatica (Right)  Chronic lower extremity pain (Secondary Area of Pain) (Right)  Chronic shoulder pain (Tertiary Area of Pain) (Bilateral) (R>L)  Hand pain (Bilateral) (R>L)  Chronic neck pain (Bilateral) (R>L)  Chronic Pain Syndrome  Chronic sacroiliac joint pain (Bilateral) (R>L)  Lumbar Stenosis With Neurogenic Claudication  Herniation of Cervical Intervertebral Disc With Radiculopathy  Cocaine Use  Marijuana Use  Elevated C-Reactive Protein (Crp)  Elevated Sed Rate  Vitamin D Deficiency  Pharmacologic Therapy  Disorder of Skeletal System  Problems Influencing Health Status  Tobacco Abuse  Wound Infection After Surgery  Preoperative Evaluation to Rule Out Surgical Contraindication  Abnormal Ecg  Essential Hypertension, Benign  Shortness of Breath    Plan of Care  Pharmacotherapy (Medications Ordered): Meds ordered this encounter  Medications  . ergocalciferol (VITAMIN D2) 1.25 MG (50000 UT) capsule    Sig: Take 1 capsule (50,000 Units total) by mouth 2 (two) times a week. X 6 weeks.    Dispense:  12 capsule    Refill:  0    Do not add this medication to the electronic  "Automatic Refill" notification system. Patient may have prescription filled one day early if pharmacy is closed on scheduled refill date.  . Cholecalciferol (VITAMIN D3) 125 MCG (5000 UT) CAPS    Sig: Take 1 capsule (5,000 Units total) by mouth daily with breakfast. Take along with calcium and magnesium.    Dispense:  30 capsule    Refill:  5    Do not place medication on "Automatic Refill".  May substitute with similar over-the-counter product.  . Magnesium 500 MG CAPS    Sig: Take 1 capsule (500 mg total) by mouth 2 (two) times daily at 8 am and 10 pm.    Dispense:  60 capsule    Refill:  5    Do not place medication on "Automatic Refill".  The patient may use similar over-the-counter product.  . calcium carbonate (CALCIUM 600) 600 MG TABS tablet    Sig: Take 1 tablet (  600 mg total) by mouth 2 (two) times daily with a meal for 30 days.    Dispense:  60 tablet    Refill:  0    Do not place medication on "Automatic Refill". Fill one day early if pharmacy is closed on scheduled refill date.    Procedure Orders     HIP INJECTION  Lab Orders     Rheumatoid factor     ANA w/Reflex if Positive  Imaging Orders     MR LUMBAR SPINE W WO CONTRAST     DG HIP UNILAT W OR W/O PELVIS 2-3 VIEWS RIGHT     DG HIP UNILAT W OR W/O PELVIS 2-3 VIEWS LEFT Referral Orders  No referral(s) requested today    Pharmacological management options:  Opioid Analgesics: I will not be prescribing any opioids at this time Membrane stabilizer: We have discussed the possibility of optimizing this mode of therapy, if tolerated Muscle relaxant: We have discussed the possibility of a trial NSAID: We have discussed the possibility of a trial Other analgesic(s): To be determined at a later time   Interventional management options: Planned, scheduled, and/or pending:    Diagnostic right IA Hip injection #1 under fluoro and no sedation.   Considering:   Diagnostic right-sided ESI  Diagnostic right-sided  transforaminal ESI  Diagnostic bilateral sacroiliac joint injections  Possible bilateral sacroiliac joint RFA  Diagnostic bilateral intra-articular shoulder injection  Diagnostic bilateral suprascapular nerve blocks  Diagnostic bilateral intra-articular hand joint injection  Diagnostic bilateral lumbar facet nerve blocks  Possible bilateral lumbar facet RFA    PRN Procedures:   None at this time   Provider-requested follow-up: Return for Procedure (no sedation): (R) Hip inj #1.  Future Appointments  Date Time Provider [place]  01/04/2019  1:45 PM Milinda Pointer, MD [facility] None    Primary Care Physician: [facility] [place] Location: [facility] Note by: Gaspar Cola, MD Date: 12/25/2018; Time: 11:45 AM

## 2018-12-25 NOTE — Patient Instructions (Addendum)
____________________________________________________________________________________________  Preparing for your procedure (without sedation)  Instructions: . Oral Intake: Do not eat or drink anything for at least 3 hours prior to your procedure. . Transportation: Unless otherwise stated by your physician, you may drive yourself after the procedure. . Blood Pressure Medicine: Take your blood pressure medicine with a sip of water the morning of the procedure. . Blood thinners: Notify our staff if you are taking any blood thinners. Depending on which one you take, there will be specific instructions on how and when to stop it. . Diabetics on insulin: Notify the staff so that you can be scheduled 1st case in the morning. If your diabetes requires high dose insulin, take only  of your normal insulin dose the morning of the procedure and notify the staff that you have done so. . Preventing infections: Shower with an antibacterial soap the morning of your procedure.  . Build-up your immune system: Take 1000 mg of Vitamin C with every meal (3 times a day) the day prior to your procedure. Marland Kitchen Antibiotics: Inform the staff if you have a condition or reason that requires you to take antibiotics before dental procedures. . Pregnancy: If you are pregnant, call and cancel the procedure. . Sickness: If you have a cold, fever, or any active infections, call and cancel the procedure. . Arrival: You must be in the facility at least 30 minutes prior to your scheduled procedure. . Children: Do not bring any children with you. . Dress appropriately: Bring dark clothing that you would not mind if they get stained. . Valuables: Do not bring any jewelry or valuables.  Procedure appointments are reserved for interventional treatments only. Marland Kitchen No Prescription Refills. . No medication changes will be discussed during procedure appointments. . No disability issues will be discussed.  Reasons to call and reschedule or  cancel your procedure: (Following these recommendations will minimize the risk of a serious complication.) . Surgeries: Avoid having procedures within 2 weeks of any surgery. (Avoid for 2 weeks before or after any surgery). . Flu Shots: Avoid having procedures within 2 weeks of a flu shots or . (Avoid for 2 weeks before or after immunizations). . Barium: Avoid having a procedure within 7-10 days after having had a radiological study involving the use of radiological contrast. (Myelograms, Barium swallow or enema study). . Heart attacks: Avoid any elective procedures or surgeries for the initial 6 months after a "Myocardial Infarction" (Heart Attack). . Blood thinners: It is imperative that you stop these medications before procedures. Let us know if you if you take any blood thinner.  . Infection: Avoid procedures during or within two weeks of an infection (including chest colds or gastrointestinal problems). Symptoms associated with infections include: Localized redness, fever, chills, night sweats or profuse sweating, burning sensation when voiding, cough, congestion, stuffiness, runny nose, sore throat, diarrhea, nausea, vomiting, cold or Flu symptoms, recent or current infections. It is specially important if the infection is over the area that we intend to treat. Marland Kitchen Heart and lung problems: Symptoms that may suggest an active cardiopulmonary problem include: cough, chest pain, breathing difficulties or shortness of breath, dizziness, ankle swelling, uncontrolled high or unusually low blood pressure, and/or palpitations. If you are experiencing any of these symptoms, cancel your procedure and contact your primary care physician for an evaluation.  Remember:  Regular Business hours are:  Monday to Thursday 8:00 AM to 4:00 PM  Provider's Schedule: Milinda Pointer, MD:  Procedure days: Tuesday and Thursday 7:30 AM to  4:00 PM  Gillis Santa, MD:  Procedure days: Monday and Wednesday 7:30 AM to 4:00  PM ____________________________________________________________________________________________    ______________________________________________________________________________________________  Specialty Pain Scale  Introduction:  There are significant differences in how pain is reported. The word pain usually refers to physical pain, but it is also a common synonym of suffering. The medical community uses a scale from 0 (zero) to 10 (ten) to report pain level. Zero (0) is described as "no pain", while ten (10) is described as "the worse pain you can imagine". The problem with this scale is that physical pain is reported along with suffering. Suffering refers to mental pain, or more often yet it refers to any unpleasant feeling, emotion or aversion associated with the perception of harm or threat of harm. It is the psychological component of pain.  Pain Specialists prefer to separate the two components. The pain scale used by this practice is the Verbal Numerical Rating Scale (VNRS-11). This scale is for the physical pain only. DO NOT INCLUDE how your pain psychologically affects you. This scale is for adults 44 years of age and older. It has 11 (eleven) levels. The 1st level is 0/10. This means: "right now, I have no pain". In the context of pain management, it also means: "right now, my physical pain is under control with the current therapy".  General Information:  The scale should reflect your current level of pain. Unless you are specifically asked for the level of your worst pain, or your average pain. If you are asked for one of these two, then it should be understood that it is over the past 24 hours.  Levels 1 (one) through 5 (five) are described below, and can be treated as an outpatient. Ambulatory pain management facilities such as ours are more than adequate to treat these levels. Levels 6 (six) through 10 (ten) are also described below, however, these must be treated as a hospitalized  patient. While levels 6 (six) and 7 (seven) may be evaluated at an urgent care facility, levels 8 (eight) through 10 (ten) constitute medical emergencies and as such, they belong in a hospital's emergency department. When having these levels (as described below), do not come to our office. Our facility is not equipped to manage these levels. Go directly to an urgent care facility or an emergency department to be evaluated.  Definitions:  Activities of Daily Living (ADL): Activities of daily living (ADL or ADLs) is a term used in healthcare to refer to people's daily self-care activities. Health professionals often use a person's ability or inability to perform ADLs as a measurement of their functional status, particularly in regard to people post injury, with disabilities and the elderly. There are two ADL levels: Basic and Instrumental. Basic Activities of Daily Living (BADL  or BADLs) consist of self-care tasks that include: Bathing and showering; personal hygiene and grooming (including brushing/combing/styling hair); dressing; Toilet hygiene (getting to the toilet, cleaning oneself, and getting back up); eating and self-feeding (not including cooking or chewing and swallowing); functional mobility, often referred to as "transferring", as measured by the ability to walk, get in and out of bed, and get into and out of a chair; the broader definition (moving from one place to another while performing activities) is useful for people with different physical abilities who are still able to get around independently. Basic ADLs include the things many people do when they get up in the morning and get ready to go out of the house: get out  of bed, go to the toilet, bathe, dress, groom, and eat. On the average, loss of function typically follows a particular order. Hygiene is the first to go, followed by loss of toilet use and locomotion. The last to go is the ability to eat. When there is only one remaining area in  which the person is independent, there is a 62.9% chance that it is eating and only a 3.5% chance that it is hygiene. Instrumental Activities of Daily Living (IADL or IADLs) are not necessary for fundamental functioning, but they let an individual live independently in a community. IADL consist of tasks that include: cleaning and maintaining the house; home establishment and maintenance; care of others (including selecting and supervising caregivers); care of pets; child rearing; managing money; managing financials (investments, etc.); meal preparation and cleanup; shopping for groceries and necessities; moving within the community; safety procedures and emergency responses; health management and maintenance (taking prescribed medications); and using the telephone or other form of communication.  Instructions:  Most patients tend to report their pain as a combination of two factors, their physical pain and their psychosocial pain. This last one is also known as "suffering" and it is reflection of how physical pain affects you socially and psychologically. From now on, report them separately.  From this point on, when asked to report your pain level, report only your physical pain. Use the following table for reference.  Pain Clinic Pain Levels (0-5/10)  Pain Level Score  Description  No Pain 0   Mild pain 1 Nagging, annoying, but does not interfere with basic activities of daily living (ADL). Patients are able to eat, bathe, get dressed, toileting (being able to get on and off the toilet and perform personal hygiene functions), transfer (move in and out of bed or a chair without assistance), and maintain continence (able to control bladder and bowel functions). Blood pressure and heart rate are unaffected. A normal heart rate for a healthy adult ranges from 60 to 100 bpm (beats per minute).   Mild to moderate pain 2 Noticeable and distracting. Impossible to hide from other people. More frequent  flare-ups. Still possible to adapt and function close to normal. It can be very annoying and may have occasional stronger flare-ups. With discipline, patients may get used to it and adapt.   Moderate pain 3 Interferes significantly with activities of daily living (ADL). It becomes difficult to feed, bathe, get dressed, get on and off the toilet or to perform personal hygiene functions. Difficult to get in and out of bed or a chair without assistance. Very distracting. With effort, it can be ignored when deeply involved in activities.   Moderately severe pain 4 Impossible to ignore for more than a few minutes. With effort, patients may still be able to manage work or participate in some social activities. Very difficult to concentrate. Signs of autonomic nervous system discharge are evident: dilated pupils (mydriasis); mild sweating (diaphoresis); sleep interference. Heart rate becomes elevated (>115 bpm). Diastolic blood pressure (lower number) rises above 100 mmHg. Patients find relief in laying down and not moving.   Severe pain 5 Intense and extremely unpleasant. Associated with frowning face and frequent crying. Pain overwhelms the senses.  Ability to do any activity or maintain social relationships becomes significantly limited. Conversation becomes difficult. Pacing back and forth is common, as getting into a comfortable position is nearly impossible. Pain wakes you up from deep sleep. Physical signs will be obvious: pupillary dilation; increased sweating; goosebumps; brisk reflexes; cold,  clammy hands and feet; nausea, vomiting or dry heaves; loss of appetite; significant sleep disturbance with inability to fall asleep or to remain asleep. When persistent, significant weight loss is observed due to the complete loss of appetite and sleep deprivation.  Blood pressure and heart rate becomes significantly elevated. Caution: If elevated blood pressure triggers a pounding headache associated with blurred  vision, then the patient should immediately seek attention at an urgent or emergency care unit, as these may be signs of an impending stroke.    Emergency Department Pain Levels (6-10/10)  Emergency Room Pain 6 Severely limiting. Requires emergency care and should not be seen or managed at an outpatient pain management facility. Communication becomes difficult and requires great effort. Assistance to reach the emergency department may be required. Facial flushing and profuse sweating along with potentially dangerous increases in heart rate and blood pressure will be evident.   Distressing pain 7 Self-care is very difficult. Assistance is required to transport, or use restroom. Assistance to reach the emergency department will be required. Tasks requiring coordination, such as bathing and getting dressed become very difficult.   Disabling pain 8 Self-care is no longer possible. At this level, pain is disabling. The individual is unable to do even the most "basic" activities such as walking, eating, bathing, dressing, transferring to a bed, or toileting. Fine motor skills are lost. It is difficult to think clearly.   Incapacitating pain 9 Pain becomes incapacitating. Thought processing is no longer possible. Difficult to remember your own name. Control of movement and coordination are lost.   The worst pain imaginable 10 At this level, most patients pass out from pain. When this level is reached, collapse of the autonomic nervous system occurs, leading to a sudden drop in blood pressure and heart rate. This in turn results in a temporary and dramatic drop in blood flow to the brain, leading to a loss of consciousness. Fainting is one of the body's self defense mechanisms. Passing out puts the brain in a calmed state and causes it to shut down for a while, in order to begin the healing process.    Summary: 1. Refer to this scale when providing [place] with your pain level. 2. Be accurate and careful when  reporting your pain level. This will help with your care. 3. Over-reporting your pain level will lead to loss of credibility. 4. Even a level of 1/10 means that there is pain and will be treated at our facility. 5. High, inaccurate reporting will be documented as "Symptom Exaggeration", leading to loss of credibility and suspicions of possible secondary gains such as obtaining more narcotics, or wanting to appear disabled, for fraudulent reasons. 6. Only pain levels of 5 or below will be seen at our facility. 7. Pain levels of 6 and above will be sent to the Emergency Department and the appointment cancelled. ______________________________________________________________________________________________   ____________________________________________________________________________________________  General Risks and Possible Complications  Patient Responsibilities: It is important that you read this as it is part of your informed consent. It is our duty to inform you of the risks and possible complications associated with treatments offered to you. It is your responsibility as a patient to read this and to ask questions about anything that is not clear or that you believe was not covered in this document.  Patient's Rights: You have the right to refuse treatment. You also have the right to change your mind, even after initially having agreed to have the treatment done. However, under this  last option, if you wait until the last second to change your mind, you may be charged for the materials used up to that point.  Introduction: Medicine is not an Chief Strategy Officer. Everything in Medicine, including the lack of treatment(s), carries the potential for danger, harm, or loss (which is by definition: Risk). In Medicine, a complication is a secondary problem, condition, or disease that can aggravate an already existing one. All treatments carry the risk of possible complications. The fact that a side effects  or complications occurs, does not imply that the treatment was conducted incorrectly. It must be clearly understood that these can happen even when everything is done following the highest safety standards.  No treatment: You can choose not to proceed with the proposed treatment alternative. The "PRO(s)" would include: avoiding the risk of complications associated with the therapy. The "CON(s)" would include: not getting any of the treatment benefits. These benefits fall under one of three categories: diagnostic; therapeutic; and/or palliative. Diagnostic benefits include: getting information which can ultimately lead to improvement of the disease or symptom(s). Therapeutic benefits are those associated with the successful treatment of the disease. Finally, palliative benefits are those related to the decrease of the primary symptoms, without necessarily curing the condition (example: decreasing the pain from a flare-up of a chronic condition, such as incurable terminal cancer).  General Risks and Complications: These are associated to most interventional treatments. They can occur alone, or in combination. They fall under one of the following six (6) categories: no benefit or worsening of symptoms; bleeding; infection; nerve damage; allergic reactions; and/or death. 1. No benefits or worsening of symptoms: In Medicine there are no guarantees, only probabilities. No healthcare provider can ever guarantee that a medical treatment will work, they can only state the probability that it may. Furthermore, there is always the possibility that the condition may worsen, either directly, or indirectly, as a consequence of the treatment. 2. Bleeding: This is more common if the patient is taking a blood thinner, either prescription or over the counter (example: Goody Powders, Fish oil, Aspirin, Garlic, etc.), or if suffering a condition associated with impaired coagulation (example: Hemophilia, cirrhosis of the liver,  low platelet counts, etc.). However, even if you do not have one on these, it can still happen. If you have any of these conditions, or take one of these drugs, make sure to notify your treating physician. 3. Infection: This is more common in patients with a compromised immune system, either due to disease (example: diabetes, cancer, human immunodeficiency virus [HIV], etc.), or due to medications or treatments (example: therapies used to treat cancer and rheumatological diseases). However, even if you do not have one on these, it can still happen. If you have any of these conditions, or take one of these drugs, make sure to notify your treating physician. 4. Nerve Damage: This is more common when the treatment is an invasive one, but it can also happen with the use of medications, such as those used in the treatment of cancer. The damage can occur to small secondary nerves, or to large primary ones, such as those in the spinal cord and brain. This damage may be temporary or permanent and it may lead to impairments that can range from temporary numbness to permanent paralysis and/or brain death. 5. Allergic Reactions: Any time a substance or material comes in contact with our body, there is the possibility of an allergic reaction. These can range from a mild skin rash (contact dermatitis) to  a severe systemic reaction (anaphylactic reaction), which can result in death. 6. Death: In general, any medical intervention can result in death, most of the time due to an unforeseen complication. ____________________________________________________________________________________________

## 2018-12-25 NOTE — Progress Notes (Signed)
Safety precautions to be maintained throughout the outpatient stay will include: orient to surroundings, keep bed in low position, maintain call bell within reach at all times, provide assistance with transfer out of bed and ambulation.  

## 2018-12-26 LAB — RHEUMATOID FACTOR: Rheumatoid fact SerPl-aCnc: <10 IU/mL (ref 0.0–13.9)

## 2018-12-26 LAB — ANA W/REFLEX IF POSITIVE: Anti Nuclear Antibody(ANA): NEGATIVE

## 2018-12-27 ENCOUNTER — Telehealth: Payer: Self-pay | Admitting: Pain Medicine

## 2018-12-27 NOTE — Telephone Encounter (Signed)
[facility] pharmacist called to see if she could change the Magnesium 500 mg to Mag oxide 400 mg OTC because that's what they had in stock.  Instructed her that that would be fine.

## 2018-12-27 NOTE — Telephone Encounter (Signed)
Ginger from the pharmacy left a voicemail stating that she would like to know if she could use some substitutions for a couple medications that were sent in for the pt. Please call back.

## 2019-01-04 ENCOUNTER — Other Ambulatory Visit: Payer: Self-pay

## 2019-01-04 ENCOUNTER — Encounter: Payer: Self-pay | Admitting: Pain Medicine

## 2019-01-04 ENCOUNTER — Ambulatory Visit
Admission: RE | Admit: 2019-01-04 | Discharge: 2019-01-04 | Disposition: A | Discharge status: Home / Self Care | Payer: Medicare Other | Source: Ambulatory Visit | Attending: Pain Medicine | Admitting: Pain Medicine

## 2019-01-04 ENCOUNTER — Ambulatory Visit (HOSPITAL_BASED_OUTPATIENT_CLINIC_OR_DEPARTMENT_OTHER): Payer: Medicare Other | Admitting: Pain Medicine

## 2019-01-04 VITALS — BP 131/66 | HR 63 | Temp 98.1°F | Resp 18 | Ht 68.0 in | Wt 160.0 lb

## 2019-01-04 DIAGNOSIS — R1031 Right lower quadrant pain: Secondary | ICD-10-CM | POA: Diagnosis present

## 2019-01-04 DIAGNOSIS — M25552 Pain in left hip: Secondary | ICD-10-CM | POA: Insufficient documentation

## 2019-01-04 DIAGNOSIS — M1611 Unilateral primary osteoarthritis, right hip: Secondary | ICD-10-CM | POA: Diagnosis present

## 2019-01-04 DIAGNOSIS — G8929 Other chronic pain: Secondary | ICD-10-CM

## 2019-01-04 DIAGNOSIS — M25551 Pain in right hip: Secondary | ICD-10-CM | POA: Diagnosis present

## 2019-01-04 MED ORDER — IOPAMIDOL (ISOVUE-M 200) INJECTION 41%
10.0000 mL | Freq: Once | INTRAMUSCULAR | Status: AC
  Administered 2019-01-04: 1 mL via INTRA_ARTICULAR
  Filled 2019-01-04: qty 10

## 2019-01-04 MED ORDER — METHYLPREDNISOLONE ACETATE 80 MG/ML IJ SUSP
80.0000 mg | Freq: Once | INTRAMUSCULAR | Status: AC
  Administered 2019-01-04: 80 mg via INTRA_ARTICULAR
  Filled 2019-01-04: qty 1

## 2019-01-04 MED ORDER — ROPIVACAINE HCL 2 MG/ML IJ SOLN
4.0000 mL | Freq: Once | INTRAMUSCULAR | Status: AC
  Administered 2019-01-04: 4 mL via INTRA_ARTICULAR
  Filled 2019-01-04: qty 10

## 2019-01-04 MED ORDER — LIDOCAINE HCL 2 % IJ SOLN
20.0000 mL | Freq: Once | INTRAMUSCULAR | Status: AC
  Administered 2019-01-04: 400 mg
  Filled 2019-01-04: qty 40

## 2019-01-04 NOTE — Progress Notes (Signed)
Safety precautions to be maintained throughout the outpatient stay will include: orient to surroundings, keep bed in low position, maintain call bell within reach at all times, provide assistance with transfer out of bed and ambulation.  

## 2019-01-04 NOTE — Patient Instructions (Addendum)
____________________________________________________________________________________________  Post-Procedure Discharge Instructions  Instructions:  Apply ice: Fill a plastic sandwich bag with crushed ice. Cover it with a small towel and apply to injection site. Apply for 15 minutes then remove x 15 minutes. Repeat sequence on day of procedure, until you go to bed. The purpose is to minimize swelling and discomfort after procedure.  Apply heat: Apply heat to procedure site starting the day following the procedure. The purpose is to treat any soreness and discomfort from the procedure.  Food intake: Start with clear liquids (like water) and advance to regular food, as tolerated.   Physical activities: Keep activities to a minimum for the first 8 hours after the procedure.   Driving: If you have received any sedation, you are not allowed to drive for 24 hours after your procedure.  Blood thinner: Restart your blood thinner 6 hours after your procedure. (Only for those taking blood thinners)  Insulin: As soon as you can eat, you may resume your normal dosing schedule. (Only for those taking insulin)  Infection prevention: Keep procedure site clean and dry.  Post-procedure Pain Diary: Extremely important that this be done correctly and accurately. Recorded information will be used to determine the next step in treatment.  Pain evaluated is that of treated area only. Do not include pain from an untreated area.  Complete every hour, on the hour, for the initial 8 hours. Set an alarm to help you do this part accurately.  Do not go to sleep and have it completed later. It will not be accurate.  Follow-up appointment: Keep your follow-up appointment after the procedure. Usually 2 weeks for most procedures. (6 weeks in the case of radiofrequency.) Bring you pain diary.   Expect:  From numbing medicine (AKA: Local Anesthetics): Numbness or decrease in pain.  Onset: Full effect within 15  minutes of injected.  Duration: It will depend on the type of local anesthetic used. On the average, 1 to 8 hours.   From steroids: Decrease in swelling or inflammation. Once inflammation is improved, relief of the pain will follow.  Onset of benefits: Depends on the amount of swelling present. The more swelling, the longer it will take for the benefits to be seen. In some cases, up to 10 days.  Duration: Steroids will stay in the system x 2 weeks. Duration of benefits will depend on multiple posibilities including persistent irritating factors.  Occasional side-effects: Facial flushing (red, warm cheeks) , cramps (if present, drink Gatorade and take over-the-counter Magnesium 450-500 mg once to twice a day).  From procedure: Some discomfort is to be expected once the numbing medicine wears off. This should be minimal if ice and heat are applied as instructed.  Call if:  You experience numbness and weakness that gets worse with time, as opposed to wearing off.  New onset bowel or bladder incontinence. (This applies to Spinal procedures only)  Emergency Numbers:  Durning business hours (Monday - Thursday, 8:00 AM - 4:00 PM) (Friday, 9:00 AM - 12:00 Noon): [phone]  After hours: [phone] ____________________________________________________________________________________________   Pain Management Discharge Instructions  General Discharge Instructions :  If you need to reach your doctor call: Monday-Friday 8:00 am - 4:00 pm at [phone] or toll free [phone].  After clinic hours [phone] to have operator reach doctor.  Bring all of your medication bottles to all your appointments in the pain clinic.  To cancel or reschedule your appointment with Pain Management please remember to call 24 hours in advance to   avoid a fee.  Refer to the educational materials which you have been given on: General Risks, I had my Procedure. Discharge Instructions, Post  Sedation.  Post Procedure Instructions:  The drugs you were given will stay in your system until tomorrow, so for the next 24 hours you should not drive, make any legal decisions or drink any alcoholic beverages.  You may eat anything you prefer, but it is better to start with liquids then soups and crackers, and gradually work up to solid foods.  Please notify your doctor immediately if you have any unusual bleeding, trouble breathing or pain that is not related to your normal pain.  Depending on the type of procedure that was done, some parts of your body may feel week and/or numb.  This usually clears up by tonight or the next day.  Walk with the use of an assistive device or accompanied by an adult for the 24 hours.  You may use ice on the affected area for the first 24 hours.  Put ice in a Ziploc bag and cover with a towel and place against area 15 minutes on 15 minutes off.  You may switch to heat after 24 hours. 

## 2019-01-04 NOTE — Progress Notes (Signed)
Patient's Name: Arthur Wilcox  MRN: [id number]  Referring Provider: [facility] [place]  DOB: Sep 02, 1951  PCP: [facility]  DOS: 01/04/2019  Note by: Oswaldo Done, MD  Service setting: Ambulatory outpatient  Specialty: Interventional Pain Management  Patient type: Established  Location: [facility] (AMB) Pain Management Facility  Visit type: Interventional Procedure   Primary Reason for Visit: Interventional Pain Management Treatment. CC: Hip Pain (right)  Procedure:          Anesthesia, Analgesia, Anxiolysis:  Type: Intra-Articular Hip Injection #1  Primary Purpose: Diagnostic Region: Posterolateral hip joint area. Level: Lower pelvic and hip joint level. Target Area: Superior aspect of the hip joint cavity, going thru the superior portion of the capsular ligament. Approach: Posterolateral approach. Laterality: Right  Type: Local Anesthesia Indication(s): Analgesia         Route: Infiltration ([place]/IM) IV Access: Declined Sedation: Declined  Local Anesthetic: Lidocaine 1-2%  Position: Lateral Decubitus with bad side up Prepped Area: Entire Posterolateral hip area. Prepping solution: ChloraPrep (2% chlorhexidine gluconate and 70% isopropyl alcohol)   Indications: 1. Osteoarthritis of hip (Right)   2. Chronic hip pain (Bilateral (R>L)   3. Chronic groin pain (Right)    Pain Score: Pre-procedure: 5 /10 Post-procedure: 5 /10  Pre-op Assessment:  Arthur Wilcox is a 68 y.o. (year old), male patient, seen today for interventional treatment. He  has a past surgical history that includes Posterior cervical laminectomy (04/18/2012); Lumbar laminectomy/decompression microdiscectomy (05/29/2012); and Lumbar wound debridement (06/19/2012). Arthur Wilcox has a current medication list which includes the following prescription(s): atorvastatin, calcium carbonate, vitamin d3, cyclobenzaprine, ergocalciferol, magnesium, meloxicam, naproxen sodium, and sildenafil. His primarily concern today  is the Hip Pain (right)  Initial Vital Signs:  Pulse/HCG Rate: 63ECG Heart Rate: (!) 58 Temp: 98.1 F (36.7 C) Resp: 16 BP: (!) 147/81 SpO2: 98 %  BMI: Estimated body mass index is 24.33 kg/m as calculated from the following:   Height as of this encounter: 5\' 8"  (1.727 m).   Weight as of this encounter: 160 lb (72.6 kg).  Risk Assessment: Allergies: Reviewed. He has No Known Allergies.  Allergy Precautions: None required Coagulopathies: Reviewed. None identified.  Blood-thinner therapy: None at this time Active Infection(s): Reviewed. None identified. Arthur Wilcox is afebrile  Site Confirmation: Arthur Wilcox was asked to confirm the procedure and laterality before marking the site Procedure checklist: Completed Consent: Before the procedure and under the influence of no sedative(s), amnesic(s), or anxiolytics, the patient was informed of the treatment options, risks and possible complications. To fulfill our ethical and legal obligations, as recommended by the American Medical Association's Code of Ethics, I have informed the patient of my clinical impression; the nature and purpose of the treatment or procedure; the risks, benefits, and possible complications of the intervention; the alternatives, including doing nothing; the risk(s) and benefit(s) of the alternative treatment(s) or procedure(s); and the risk(s) and benefit(s) of doing nothing. The patient was provided information about the general risks and possible complications associated with the procedure. These may include, but are not limited to: failure to achieve desired goals, infection, bleeding, organ or nerve damage, allergic reactions, paralysis, and death. In addition, the patient was informed of those risks and complications associated to the procedure, such as failure to decrease pain; infection; bleeding; organ or nerve damage with subsequent damage to sensory, motor, and/or autonomic systems, resulting in permanent pain,  numbness, and/or weakness of one or several areas of the body; allergic reactions; (i.e.: anaphylactic reaction); and/or death.  Furthermore, the patient was informed of those risks and complications associated with the medications. These include, but are not limited to: allergic reactions (i.e.: anaphylactic or anaphylactoid reaction(s)); adrenal axis suppression; blood sugar elevation that in diabetics may result in ketoacidosis or comma; water retention that in patients with history of congestive heart failure may result in shortness of breath, pulmonary edema, and decompensation with resultant heart failure; weight gain; swelling or edema; medication-induced neural toxicity; particulate matter embolism and blood vessel occlusion with resultant organ, and/or nervous system infarction; and/or aseptic necrosis of one or more joints. Finally, the patient was informed that Medicine is not an exact science; therefore, there is also the possibility of unforeseen or unpredictable risks and/or possible complications that may result in a catastrophic outcome. The patient indicated having understood very clearly. We have given the patient no guarantees and we have made no promises. Enough time was given to the patient to ask questions, all of which were answered to the patient's satisfaction. Arthur Wilcox has indicated that he wanted to continue with the procedure. Attestation: I, the ordering provider, attest that I have discussed with the patient the benefits, risks, side-effects, alternatives, likelihood of achieving goals, and potential problems during recovery for the procedure that I have provided informed consent. Date  Time: 01/04/2019  1:25 PM  Pre-Procedure Preparation:  Monitoring: As per clinic protocol. Respiration, ETCO2, SpO2, BP, heart rate and rhythm monitor placed and checked for adequate function Safety Precautions: Patient was assessed for positional comfort and pressure points before starting the  procedure. Time-out: I initiated and conducted the "Time-out" before starting the procedure, as per protocol. The patient was asked to participate by confirming the accuracy of the "Time Out" information. Verification of the correct person, site, and procedure were performed and confirmed by me, the nursing staff, and the patient. "Time-out" conducted as per Joint Commission's Universal Protocol (UP.01.01.01). Time: 1413  Description of Procedure:          Safety Precautions: Aspiration looking for blood return was conducted prior to all injections. At no point did we inject any substances, as a needle was being advanced. No attempts were made at seeking any paresthesias. Safe injection practices and needle disposal techniques used. Medications properly checked for expiration dates. SDV (single dose vial) medications used. Description of the Procedure: Protocol guidelines were followed. The patient was placed in position over the fluoroscopy table. The target area was identified and the area prepped in the usual manner. Skin & deeper tissues infiltrated with local anesthetic. Appropriate amount of time allowed to pass for local anesthetics to take effect. The procedure needles were then advanced to the target area. Proper needle placement secured. Negative aspiration confirmed. Solution injected in intermittent fashion, asking for systemic symptoms every 0.5cc of injectate. The needles were then removed and the area cleansed, making sure to leave some of the prepping solution back to take advantage of its long term bactericidal properties. Vitals:   01/04/19 1323 01/04/19 1411 01/04/19 1418  BP: (!) 147/81 (!) 132/59 131/66  Pulse: 63    Resp: 16 16 18   Temp: 98.1 F (36.7 C)    TempSrc: Oral    SpO2: 98% 98% 98%  Weight: 160 lb (72.6 kg)    Height: 5\' 8"  (1.727 m)      Start Time: 1413 hrs. End Time: 1417 hrs. Materials:  Needle(s) Type: Spinal Needle Gauge: 22G Length:  5.0-in Medication(s): Please see orders for medications and dosing details.  Imaging Guidance (Non-Spinal):  Type of Imaging Technique: Fluoroscopy Guidance (Non-Spinal) Indication(s): Assistance in needle guidance and placement for procedures requiring needle placement in or near specific anatomical locations not easily accessible without such assistance. Exposure Time: Please see nurses notes. Contrast: Before injecting any contrast, we confirmed that the patient did not have an allergy to iodine, shellfish, or radiological contrast. Once satisfactory needle placement was completed at the desired level, radiological contrast was injected. Contrast injected under live fluoroscopy. No contrast complications. See chart for type and volume of contrast used. Fluoroscopic Guidance: I was personally present during the use of fluoroscopy. "Tunnel Vision Technique" used to obtain the best possible view of the target area. Parallax error corrected before commencing the procedure. "Direction-depth-direction" technique used to introduce the needle under continuous pulsed fluoroscopy. Once target was reached, antero-posterior, oblique, and lateral fluoroscopic projection used confirm needle placement in all planes. Images permanently stored in EMR. Interpretation: I personally interpreted the imaging intraoperatively. Adequate needle placement confirmed in multiple planes. Appropriate spread of contrast into desired area was observed. No evidence of afferent or efferent intravascular uptake. Permanent images saved into the patient's record.  Antibiotic Prophylaxis:   Anti-infectives (From admission, onward)   None     Indication(s): None identified  Post-operative Assessment:  Post-procedure Vital Signs:  Pulse/HCG Rate: 63(!) 54 Temp: 98.1 F (36.7 C) Resp: 18 BP: 131/66 SpO2: 98 %  EBL: None  Complications: No immediate post-treatment complications observed by team, or reported by  patient.  Note: The patient tolerated the entire procedure well. A repeat set of vitals were taken after the procedure and the patient was kept under observation following institutional policy, for this type of procedure. Post-procedural neurological assessment was performed, showing return to baseline, prior to discharge. The patient was provided with post-procedure discharge instructions, including a section on how to identify potential problems. Should any problems arise concerning this procedure, the patient was given instructions to immediately contact us, at any time, without hesitation. In any case, we plan to contact the patient by telephone for a follow-up status report regarding this interventional procedure.  Comments:  No additional relevant information.  Plan of Care    Imaging Orders     DG C-Arm 1-60 Min-No Report  Procedure Orders     HIP INJECTION  Medications ordered for procedure: Meds ordered this encounter  Medications  . iopamidol (ISOVUE-M) 41 % intrathecal injection 10 mL    Must be Myelogram-compatible. If not available, you may substitute with a water-soluble, non-ionic, hypoallergenic, myelogram-compatible radiological contrast medium.  Marland Kitchen. lidocaine (XYLOCAINE) 2 % (with pres) injection 400 mg  . ropivacaine (PF) 2 mg/mL (0.2%) (NAROPIN) injection 4 mL  . methylPREDNISolone acetate (DEPO-MEDROL) injection 80 mg   Medications administered: We administered iopamidol, lidocaine, ropivacaine (PF) 2 mg/mL (0.2%), and methylPREDNISolone acetate.  See the medical record for exact dosing, route, and time of administration.  Disposition: Discharge home  Discharge Date & Time: 01/04/2019; 1430 hrs.   Physician-requested Follow-up: Return for post-procedure eval (2 wks), w/ Dr. Laban EmperorNaveira.  Future Appointments  Date Time Provider Department Center  01/15/2019  3:00 PM [facility]  01/24/2019  8:15 AM Delano MetzNaveira, Paola Flynt, [facility] None   Primary Care  [facility] Note by: Oswaldo DoneFrancisco A Marque Bango, MD Date: 01/04/2019; Time: 3:25 PM  Disclaimer:  Medicine is not an Visual merchandiserexact science. The only guarantee in medicine is that nothing is guaranteed. It is important to note that the decision to proceed  with this intervention was based on the information collected from the patient. The Data and conclusions were drawn from the patient's questionnaire, the interview, and the physical examination. Because the information was provided in large part by the patient, it cannot be guaranteed that it has not been purposely or unconsciously manipulated. Every effort has been made to obtain as much relevant data as possible for this evaluation. It is important to note that the conclusions that lead to this procedure are derived in large part from the available data. Always take into account that the treatment will also be dependent on availability of resources and existing treatment guidelines, considered by other Pain Management Practitioners as being common knowledge and practice, at the time of the intervention. For Medico-Legal purposes, it is also important to point out that variation in procedural techniques and pharmacological choices are the acceptable norm. The indications, contraindications, technique, and results of the above procedure should only be interpreted and judged by a Board-Certified Interventional Pain Specialist with extensive familiarity and expertise in the same exact procedure and technique.

## 2019-01-05 ENCOUNTER — Telehealth: Payer: Self-pay

## 2019-01-05 NOTE — Telephone Encounter (Signed)
Post procedure phone call.  Patient states he is doing good.  

## 2019-01-15 ENCOUNTER — Ambulatory Visit
Admission: RE | Admit: 2019-01-15 | Discharge: 2019-01-15 | Disposition: A | Discharge status: Home / Self Care | Payer: Medicare Other | Source: Ambulatory Visit | Attending: Pain Medicine | Admitting: Pain Medicine

## 2019-01-15 DIAGNOSIS — R700 Elevated erythrocyte sedimentation rate: Secondary | ICD-10-CM | POA: Diagnosis present

## 2019-01-15 DIAGNOSIS — Z8739 Personal history of other diseases of the musculoskeletal system and connective tissue: Secondary | ICD-10-CM | POA: Diagnosis present

## 2019-01-15 DIAGNOSIS — R7982 Elevated C-reactive protein (CRP): Secondary | ICD-10-CM

## 2019-01-15 DIAGNOSIS — M5441 Lumbago with sciatica, right side: Secondary | ICD-10-CM | POA: Diagnosis present

## 2019-01-15 DIAGNOSIS — G8929 Other chronic pain: Secondary | ICD-10-CM

## 2019-01-15 DIAGNOSIS — Z8661 Personal history of infections of the central nervous system: Secondary | ICD-10-CM

## 2019-01-15 DIAGNOSIS — M51369 Other intervertebral disc degeneration, lumbar region without mention of lumbar back pain or lower extremity pain: Secondary | ICD-10-CM

## 2019-01-15 DIAGNOSIS — M5136 Other intervertebral disc degeneration, lumbar region: Secondary | ICD-10-CM | POA: Insufficient documentation

## 2019-01-15 DIAGNOSIS — M961 Postlaminectomy syndrome, not elsewhere classified: Secondary | ICD-10-CM

## 2019-01-15 DIAGNOSIS — M79604 Pain in right leg: Secondary | ICD-10-CM | POA: Diagnosis present

## 2019-01-15 MED ORDER — GADOBUTROL 1 MMOL/ML IV SOLN
7.0000 mL | Freq: Once | INTRAVENOUS | Status: AC | PRN
  Administered 2019-01-15: 7 mL via INTRAVENOUS

## 2019-01-23 NOTE — Progress Notes (Signed)
Patient's Name: Arthur Wilcox  MRN: [id number]  Referring Provider: [facility], [facility]*  DOB: Apr 11, 1951  PCP: [facility], [place]: 01/24/2019  Note by: Gaspar Cola, MD  Service setting: Ambulatory outpatient  Specialty: Interventional Pain Management  Location: [facility] (AMB) Pain Management Facility    Patient type: Established   Primary Reason(s) for Visit: Encounter for post-procedure evaluation of chronic illness with mild to moderate exacerbation CC: Hip Pain (right)  HPI  Arthur Wilcox is a 68 y.o. year old, male patient, who comes today for a post-procedure evaluation. He has Preoperative evaluation to rule out surgical contraindication; Abnormal ECG; Essential hypertension, benign; Shortness of breath; Tobacco abuse; Herniation of cervical intervertebral disc with radiculopathy; Lumbar stenosis with neurogenic claudication; Wound infection after surgery; Chronic low back pain (Primary Area of Pain) (Bilateral) (R>L) w/ sciatica (Right); Chronic lower extremity pain (Secondary Area of Pain) (Right); Chronic shoulder pain (Tertiary Area of Pain) (Bilateral) (R>L); Hand pain (Bilateral) (R>L); Chronic neck pain (Bilateral) (R>L); Chronic pain syndrome; Pharmacologic therapy; Disorder of skeletal system; Problems influencing health status; Chronic sacroiliac joint pain (Bilateral) (R>L); Elevated C-reactive protein (CRP); Elevated sed rate; Vitamin D deficiency; Cocaine use; Marijuana use; Chronic upper extremity pain (Fourth Area of Pain) (Bilateral) (R>L); Failed back surgical syndrome; History of osteomyelitis of the Lumbar Spine; History of CNS infection (Epidural Abscess); 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 His primarily concern today is the Hip Pain (right)  Pain Assessment: Location: Right Hip Radiating: denies Onset: More than a month ago Duration: Chronic pain Quality: Dull, Nagging Severity: 1 /10 (subjective, self-reported pain score)  Note: Reported level is compatible with observation.                         When using our objective Pain Scale, levels between 6 and 10/10 are said to belong in an emergency room, as it progressively worsens from a 6/10, described as severely limiting, requiring emergency care not usually available at an outpatient pain management facility. At a 6/10 level, communication becomes difficult and requires great effort. Assistance to reach the emergency department may be required. Facial flushing and profuse sweating along with potentially dangerous increases in heart rate and blood pressure will be evident. Effect on ADL: "I am able to do more, and I sleep better" Timing: Constant Modifying factors: injection BP: 134/70  HR: 49  Arthur Wilcox comes in today for post-procedure evaluation.  Rheumatoid factor and ANA testing came back negative.  Today we went over the results of his lumbar MRI.  As I walked into the room, he me and indicated that he has not experienced such good relief of his pain for years.  He indicates now been able to continue working cutting wood, which I warned him could trigger the pain to come back.  In any case, since he is feeling much better, I have decided to go ahead and place a PRN order for a right-sided intra-articular hip joint injection, in case the pain returns.  At this point he is no longer having the groin pain or the pain in the hip area.  He still having some problems on the lower back but that is because his MRI shows that he has some  spinal stenosis.  I told him that I could also assist with this but he indicated that he is not bad enough to require any injections at this point.  I respect this and  I told him that all he needs to ischemia call when the pain returns.  Further details on both, my assessment(s), as well as the proposed treatment plan, please see below.  Post-Procedure Assessment  01/04/2019 Procedure: Diagnostic right-sided intra-articular hip joint injection #1 under fluoroscopic guidance, no sedation Pre-procedure pain score:  5/10 Post-procedure pain score: 5/10 No initial benefit, possibly due to rapid discharge after no sedation procedure, without enough time to allow full onset of block. Influential Factors: BMI: 22.05 kg/m Intra-procedural challenges: None observed.         Assessment challenges: None detected.              Reported side-effects: None.        Post-procedural adverse reactions or complications: None reported         Sedation: No sedation used. When no sedatives are used, the analgesic levels obtained are directly associated to the effectiveness of the local anesthetics. However, when sedation is provided, the level of analgesia obtained during the initial 1 hour following the intervention, is believed to be the result of a combination of factors. These factors may include, but are not limited to: 1. The effectiveness of the local anesthetics used. 2. The effects of the analgesic(s) and/or anxiolytic(s) used. 3. The degree of discomfort experienced by the patient at the time of the procedure. 4. The patients ability and reliability in recalling and recording the events. 5. The presence and influence of possible secondary gains and/or psychosocial factors. Reported result: Relief experienced during the 1st hour after the procedure: 0 % (Ultra-Short Term Relief)            Interpretative annotation: Clinically appropriate result. No IV Analgesic or Anxiolytic given, therefore benefits are completely due to Local Anesthetic effects.          Effects of local anesthetic: The analgesic effects attained during this period are directly associated to the  localized infiltration of local anesthetics and therefore cary significant diagnostic value as to the etiological location, or anatomical origin, of the pain. Expected duration of relief is directly dependent on the pharmacodynamics of the local anesthetic used. Long-acting (4-6 hours) anesthetics used.  Reported result: Relief during the next 4 to 6 hour after the procedure: 0 % (Short-Term Relief)            Interpretative annotation: Clinically appropriate result. Analgesia during this period is likely to be Local Anesthetic-related.          Long-term benefit: Defined as the period of time past the expected duration of local anesthetics (1 hour for short-acting and 4-6 hours for long-acting). With the possible exception of prolonged sympathetic blockade from the local anesthetics, benefits during this period are typically attributed to, or associated with, other factors such as analgesic sensory neuropraxia, antiinflammatory effects, or beneficial biochemical changes provided by agents other than the local anesthetics.  Reported result: Extended relief following procedure: 99.9 % (Long-Term Relief)            Interpretative annotation: Clinically possible results. Good relief. No permanent benefit expected. Inflammation plays a part in the etiology to the pain.          Current benefits: Defined as reported results that persistent at this point in time.   Analgesia: 90 % Arthur Wilcox reports improvement  of arthralgia. Function: Arthur Wilcox reports improvement in function ROM: Arthur Wilcox reports improvement in ROM Interpretative annotation: Recurrence of symptoms. No permanent benefit expected. Effective diagnostic intervention.          Interpretation: Results would suggest a successful diagnostic intervention.                  Plan:  Set up procedure as a PRN palliative treatment option for this patient.                Laboratory Chemistry  Inflammation Markers (CRP: Acute Phase) (ESR: Chronic  Phase) Lab Results  Component Value Date   CRP 3.4 (H) 12/07/2018   ESRSEDRATE 32 (H) 12/07/2018                         Renal Markers Lab Results  Component Value Date   BUN 16 12/07/2018   CREATININE 1.09 12/07/2018   GFRAA >60 12/07/2018   GFRNONAA >60 12/07/2018                             Hepatic Markers Lab Results  Component Value Date   AST 24 12/07/2018   ALT 16 12/07/2018   ALBUMIN 4.4 12/07/2018                        Note: Lab results reviewed.  Recent Imaging Results   Results for orders placed in visit on 01/04/19  DG C-Arm 1-60 Min-No Report   Narrative Fluoroscopy was utilized by the requesting physician.  No radiographic  interpretation.    Imaging Review  Lumbosacral Imaging: Lumbar MR w/wo contrast:  Results for orders placed during the hospital encounter of 01/15/19  MR LUMBAR SPINE W WO CONTRAST   Narrative CLINICAL DATA:  Low back pain since 2013, worse over the past year. History of lumbar surgery in 2013.  EXAM: MRI LUMBAR SPINE WITHOUT AND WITH CONTRAST  TECHNIQUE: Multiplanar and multiecho pulse sequences of the lumbar spine were obtained without and with intravenous contrast.  CONTRAST:  7 cc Gadavist IV.  COMPARISON:  MRI lumbar spine 06/19/2012 and 01/13/2012.  FINDINGS: Segmentation:  Standard.  Alignment:  Maintained.  Vertebrae: No fracture or worrisome lesion. Degenerative endplate signal change Z6-1 noted.  Conus medullaris and cauda equina: Conus extends to the L2 level. Conus and cauda equina appear normal.  Paraspinal and other soft tissues: Negative. Spinal infection seen on the 06/19/2012 MRI has resolved.  Disc levels:  T12-L1: Negative.  L1-2: Negative.  L2-3: Shallow disc bulge, mild facet arthropathy and ligamentum flavum thickening are seen. The central canal and foramina are open.  L3-4: The patient is status post decompressive laminotomies. Loss of disc space height is seen. There is a disc bulge  eccentric to the right, facet arthropathy and ligamentum flavum thickening. Moderate central canal stenosis is present and there is narrowing in the right subarticular recess. Moderate bilateral foraminal narrowing is also present. Central canal stenosis appears somewhat improved compared to the preoperative MRI.  L4-5: The patient is status post decompressive laminotomies. There is a disc bulge, ligamentum flavum thickening and facet arthropathy. Moderately severe central canal stenosis appears worse than on the prior MRI. Mild to moderate bilateral foraminal narrowing is worse on the right.  L5-S1: Moderate to advanced facet degenerative disease is worse on the right. There is a shallow disc bulge with a superimposed cephalad extending central  and left paracentral protrusion. Ligamentum flavum thickening is identified. There is moderate to moderately severe central canal stenosis and narrowing in the left subarticular recess which could impact the descending left S1 root. Mild to moderate foraminal narrowing is worse on the left.  IMPRESSION: Status post decompressive laminotomies at L3-4. Moderate central canal stenosis appears improved compared to the prior examination but there is protruding disc in the right subarticular recess which could impact the descending right L4 root. Moderate foraminal narrowing is also seen.  Status post decompressive laminotomies at L4-5. Moderately severe central canal stenosis at this level due to disc bulging ligamentum flavum thickening appears worse than on the preoperative MRI.  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 There is moderate to moderately severe central canal stenosis at this level.   Electronically Signed   By: Inge Rise M.D.   On: 01/15/2019 16:08    Lumbar DG 1V:  Results for orders placed during  the hospital encounter of 05/29/12  DG Lumbar Spine 1 View   Narrative *RADIOLOGY REPORT*  Clinical Data: L3-L5 laminectomy and discectomy.  LUMBAR SPINE - 1 VIEW  Comparison: None.  Findings: Single image shows tissue spreaders posteriorly with a probe between the spinous processes of L3 and L4, directed at the pedicle level of L4.  IMPRESSION: Pedicle level L4 localized.  Original Report Authenticated By: Jules Schick, M.D.   Lumbar DG Bending views:  Results for orders placed during the hospital encounter of 12/07/18  DG Lumbar Spine Complete W/Bend   Narrative CLINICAL DATA:  Arthralgias in multiple areas including the low back. History of osteoarthritis.  EXAM: LUMBAR SPINE - COMPLETE WITH BENDING VIEWS  COMPARISON:  Coronal and sagittal reconstructed images through the lumbar spine from an abdominal and pelvic CT scan dated November 13, 2018  FINDINGS: The lumbar vertebral bodies are preserved in height. There is moderate disc space narrowing at L3-4 with milder narrowing at L4-5. There is no spondylolisthesis. There are anterior and lateral endplate osteophytes in the mid and lower lumbar spine. There is no pars defect. There is facet joint hypertrophy from L3-4 through L5-S1. The pedicles and transverse processes are intact.  IMPRESSION: Moderate degenerative disc disease at L3-4 and L4-5 with facet joint degenerative change from L3 inferiorly. No compression fracture or spondylolisthesis   Electronically Signed   By: David  Martinique M.D.   On: 12/07/2018 14:50    Note: Imaging results reviewed.        Interpretation Report: Fluoroscopy was used during the procedure to assist with needle guidance. The images were interpreted intraoperatively by the requesting physician.  Meds   Current Outpatient Medications:  .  atorvastatin (LIPITOR) 40 MG tablet, Take 40 mg by mouth daily., Disp: , Rfl:  .  calcium carbonate (CALCIUM 600) 600 MG TABS tablet, Take 1  tablet (600 mg total) by mouth 2 (two) times daily with a meal for 30 days., Disp: 60 tablet, Rfl: 0 .  Cholecalciferol (VITAMIN D3) 125 MCG (5000 UT) CAPS, Take 1 capsule (5,000 Units total) by mouth daily with breakfast. Take along with calcium and magnesium., Disp: 30 capsule, Rfl: 5 .  cyclobenzaprine (FLEXERIL) 10 MG tablet, Take 10 mg by mouth at bedtime as needed. For pain, Disp: , Rfl:  .  ergocalciferol (VITAMIN D2) 1.25 MG (50000 UT) capsule, Take 1 capsule (50,000 Units total) by mouth 2 (two) times a week. X 6 weeks.,  Disp: 12 capsule, Rfl: 0 .  Magnesium 500 MG CAPS, Take 1 capsule (500 mg total) by mouth 2 (two) times daily at 8 am and 10 pm., Disp: 60 capsule, Rfl: 5 .  meloxicam (MOBIC) 15 MG tablet, Take 15 mg by mouth daily., Disp: , Rfl:  .  naproxen sodium (ALEVE) 220 MG tablet, Take 220 mg by mouth daily as needed., Disp: , Rfl:  .  sildenafil (VIAGRA) 100 MG tablet, Take 100 mg by mouth daily as needed for erectile dysfunction., Disp: , Rfl:   ROS  Constitutional: Denies any fever or chills Gastrointestinal: No reported hemesis, hematochezia, vomiting, or acute GI distress Musculoskeletal: Denies any acute onset joint swelling, redness, loss of ROM, or weakness Neurological: No reported episodes of acute onset apraxia, aphasia, dysarthria, agnosia, amnesia, paralysis, loss of coordination, or loss of consciousness  Allergies  Arthur Wilcox has No Known Allergies.  PFSH  Drug: Arthur Wilcox  reports current drug use. Drug: Marijuana. Alcohol:  reports current alcohol use of about 1.0 standard drinks of alcohol per week. Tobacco:  reports that he has been smoking cigarettes and cigars. He has a 22.00 pack-year smoking history. He has never used smokeless tobacco. Medical:  has a past medical history of Anxiety, Chronic back pain, Chronic back pain, Chronic neck pain, Depression, Essential hypertension, benign, Headache(784.0), History of foot fracture, Hyperlipidemia, Joint disease,  Migraine, Osteoarthritis, Seizures ([place]), and Shortness of breath. Surgical: Arthur Wilcox  has a past surgical history that includes Posterior cervical laminectomy (04/18/2012); Lumbar laminectomy/decompression microdiscectomy (05/29/2012); and Lumbar wound debridement (06/19/2012). Family: family history includes Coronary artery disease in his brother; Diabetes type II in his brother and sister; Hypertension in his mother; Stroke in his mother.  Constitutional Exam  General appearance: Well nourished, well developed, and well hydrated. In no apparent acute distress Vitals:   01/24/19 0809  BP: 134/70  Pulse: 67  Resp: 18  Temp: 97.7 F (36.5 C)  SpO2: 100%  Weight: 145 lb (65.8 kg)  Height: '5\' 8"'  (1.727 m)   BMI Assessment: Estimated body mass index is 22.05 kg/m as calculated from the following:   Height as of this encounter: '5\' 8"'  (1.727 m).   Weight as of this encounter: 145 lb (65.8 kg).  BMI interpretation table: BMI level Category Range association with higher incidence of chronic pain  <18 kg/m2 Underweight   18.5-24.9 kg/m2 Ideal body weight   25-29.9 kg/m2 Overweight Increased incidence by 20%  30-34.9 kg/m2 Obese (Class I) Increased incidence by 68%  35-39.9 kg/m2 Severe obesity (Class II) Increased incidence by 136%  >40 kg/m2 Extreme obesity (Class III) Increased incidence by 254%   Patient's current BMI Ideal Body weight  Body mass index is 22.05 kg/m. Ideal body weight: 68.4 kg (150 lb 12.7 oz)   BMI Readings from Last 4 Encounters:  01/24/19 22.05 kg/m  01/04/19 24.33 kg/m  12/25/18 25.39 kg/m  12/07/18 25.39 kg/m   Wt Readings from Last 4 Encounters:  01/24/19 145 lb (65.8 kg)  01/04/19 160 lb (72.6 kg)  12/25/18 167 lb (75.8 kg)  12/07/18 167 lb (75.8 kg)  Psych/Mental status: Alert, oriented x 3 (person, place, & time)       Eyes: PERLA Respiratory: No evidence of acute respiratory distress  Cervical Spine Area Exam  Skin & Axial Inspection: No  masses, redness, edema, swelling, or associated skin lesions Alignment: Symmetrical Functional ROM: Unrestricted ROM      Stability: No instability detected Muscle Tone/Strength: Functionally intact. No obvious  neuro-muscular anomalies detected. Sensory (Neurological): Unimpaired Palpation: No palpable anomalies              Upper Extremity (UE) Exam    Side: Right upper extremity  Side: Left upper extremity  Skin & Extremity Inspection: Skin color, temperature, and hair growth are WNL. No peripheral edema or cyanosis. No masses, redness, swelling, asymmetry, or associated skin lesions. No contractures.  Skin & Extremity Inspection: Skin color, temperature, and hair growth are WNL. No peripheral edema or cyanosis. No masses, redness, swelling, asymmetry, or associated skin lesions. No contractures.  Functional ROM: Unrestricted ROM          Functional ROM: Unrestricted ROM          Muscle Tone/Strength: Functionally intact. No obvious neuro-muscular anomalies detected.  Muscle Tone/Strength: Functionally intact. No obvious neuro-muscular anomalies detected.  Sensory (Neurological): Unimpaired          Sensory (Neurological): Unimpaired          Palpation: No palpable anomalies              Palpation: No palpable anomalies              Provocative Test(s):  Phalen's test: deferred Tinel's test: deferred Apley's scratch test (touch opposite shoulder):  Action 1 (Across chest): deferred Action 2 (Overhead): deferred Action 3 (LB reach): deferred   Provocative Test(s):  Phalen's test: deferred Tinel's test: deferred Apley's scratch test (touch opposite shoulder):  Action 1 (Across chest): deferred Action 2 (Overhead): deferred Action 3 (LB reach): deferred    Thoracic Spine Area Exam  Skin & Axial Inspection: No masses, redness, or swelling Alignment: Symmetrical Functional ROM: Unrestricted ROM Stability: No instability detected Muscle Tone/Strength: Functionally intact. No obvious  neuro-muscular anomalies detected. Sensory (Neurological): Unimpaired Muscle strength & Tone: No palpable anomalies  Lumbar Spine Area Exam  Skin & Axial Inspection: No masses, redness, or swelling Alignment: Symmetrical Functional ROM: Unrestricted ROM       Stability: No instability detected Muscle Tone/Strength: Functionally intact. No obvious neuro-muscular anomalies detected. Sensory (Neurological): Unimpaired Palpation: No palpable anomalies       Provocative Tests: Hyperextension/rotation test: deferred today       Lumbar quadrant test (Kemp's test): deferred today       Lateral bending test: deferred today       Patrick's Maneuver: deferred today                   FABER* test: deferred today                   S-I anterior distraction/compression test: deferred today         S-I lateral compression test: deferred today         S-I Thigh-thrust test: deferred today         S-I Gaenslen's test: deferred today         *(Flexion, ABduction and External Rotation)  Gait & Posture Assessment  Ambulation: Unassisted Gait: Relatively normal for age and body habitus Posture: WNL   Lower Extremity Exam    Side: Right lower extremity  Side: Left lower extremity  Stability: No instability observed          Stability: No instability observed          Skin & Extremity Inspection: Skin color, temperature, and hair growth are WNL. No peripheral edema or cyanosis. No masses, redness, swelling, asymmetry, or associated skin lesions. No contractures.  Skin & Extremity Inspection: Skin color,  temperature, and hair growth are WNL. No peripheral edema or cyanosis. No masses, redness, swelling, asymmetry, or associated skin lesions. No contractures.  Functional ROM: Unrestricted ROM                  Functional ROM: Unrestricted ROM                  Muscle Tone/Strength: Functionally intact. No obvious neuro-muscular anomalies detected.  Muscle Tone/Strength: Functionally intact. No obvious  neuro-muscular anomalies detected.  Sensory (Neurological): Unimpaired        Sensory (Neurological): Unimpaired        DTR: Patellar: deferred today Achilles: deferred today Plantar: deferred today  DTR: Patellar: deferred today Achilles: deferred today Plantar: deferred today  Palpation: No palpable anomalies  Palpation: No palpable anomalies   Assessment   Status Diagnosis  Controlled Controlled Controlled 1. Chronic low back pain (Primary area of Pain) (Bilateral) (R>L) w/o sciatica   2. Chronic groin pain (Right)   3. Failed back surgical syndrome   4. Chronic lower extremity pain (Secondary Area of Pain) (Right)   5. Chronic shoulder pain (Tertiary Area of Pain) (Bilateral) (R>L)   6. Chronic upper extremity pain (Fourth Area of Pain) (Bilateral) (R>L)   7. History of CNS infection (Epidural Abscess)   8. History of osteomyelitis of the Lumbar Spine   9. Elevated C-reactive protein (CRP)   10. Elevated sed rate   11. Chronic hip pain (Bilateral (R>L)   12. Osteoarthritis of hip (Right)   13. Abnormal MRI, lumbar spine (01/15/2019)      Updated Problems: Problem  Chronic low back pain (Primary area of Pain) (Bilateral) (R>L) w/o sciatica  Abnormal MRI, lumbar spine (01/15/2019)   FINDINGS: Segmentation:  Standard.  Alignment:  Maintained.  Vertebrae: No fracture or worrisome lesion. Degenerative endplate signal change T9-0 noted.  Conus medullaris and cauda equina: Conus extends to the L2 level. Conus and cauda equina appear normal.  Paraspinal and other soft tissues: Negative. Spinal infection seen on the 06/19/2012 MRI has resolved.  Disc levels:  T12-L1: Negative.  L1-2: Negative.  L2-3: Shallow disc bulge, mild facet arthropathy and ligamentum flavum thickening are seen. The central canal and foramina are open.  L3-4: The patient is status post decompressive laminotomies. Loss of disc space height is seen. There is a disc bulge eccentric to  the right, facet arthropathy and ligamentum flavum thickening. Moderate central canal stenosis is present and there is narrowing in the right subarticular recess. Moderate bilateral foraminal narrowing is also present. Central canal stenosis appears somewhat improved compared to the preoperative MRI.  L4-5: The patient is status post decompressive laminotomies. There is a disc bulge, ligamentum flavum thickening and facet arthropathy. Moderately severe central canal stenosis appears worse than on the prior MRI. Mild to moderate bilateral foraminal narrowing is worse on the right.  L5-S1: Moderate to advanced facet degenerative disease is worse on the right. There is a shallow disc bulge with a superimposed cephalad extending central and left paracentral protrusion. Ligamentum flavum thickening is identified. There is moderate to moderately severe central canal stenosis and narrowing in the left subarticular recess which could impact the descending left S1 root. Mild to moderate foraminal narrowing is worse on the left.  IMPRESSION: Status post decompressive laminotomies at L3-4. Moderate central canal stenosis appears improved compared to the prior examination but there is protruding disc in the right subarticular recess which could impact the descending right L4 root. Moderate foraminal narrowing is also seen.  Status post decompressive laminotomies at L4-5. Moderately severe central canal stenosis at this level due to disc bulging ligamentum flavum thickening appears worse than on the preoperative MRI.  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 There is moderate to moderately severe central canal stenosis at this level.   Electronically Signed   By: Inge Rise M.D.   On: 01/15/2019 16:08      Plan of Care  Pharmacotherapy (Medications Ordered): No  orders of the defined types were placed in this encounter.  Medications administered today: Eydan Chianese had no medications administered during this visit.   Procedure Orders     HIP INJECTION Lab Orders  No laboratory test(s) ordered today   Imaging Orders  No imaging studies ordered today   Referral Orders  No referral(s) requested today   Interventional management options: Planned, scheduled, and/or pending:   None at this time   Considering:   Diagnostic right-sided ESI  Diagnostic right-sided transforaminal ESI  Diagnostic bilateral sacroiliac joint injections  Possible bilateral sacroiliac joint RFA  Diagnostic bilateral intra-articular shoulder injection  Diagnostic bilateral suprascapular nerve blocks  Diagnostic bilateral intra-articular hand joint injection  Diagnostic bilateral lumbar facet nerve blocks  Possible bilateral lumbar facet RFA    Palliative PRN treatment(s):   Diagnostic right IA Hip injection #2 under fluoro and no sedation.   Provider-requested follow-up: Return for PRN Procedure: (R) Hip inj #2 (w/ seadation).  No future appointments. Primary Care Physician: [facility] [place] Location: [facility] Note by: Gaspar Cola, MD Date: 01/24/2019; Time: 9:19 AM

## 2019-01-24 ENCOUNTER — Other Ambulatory Visit: Payer: Self-pay

## 2019-01-24 ENCOUNTER — Encounter: Payer: Self-pay | Admitting: Pain Medicine

## 2019-01-24 ENCOUNTER — Ambulatory Visit: Payer: Medicare Other | Attending: Pain Medicine | Admitting: Pain Medicine

## 2019-01-24 VITALS — BP 134/70 | HR 67 | Temp 97.7°F | Resp 18 | Ht 68.0 in | Wt 145.0 lb

## 2019-01-24 DIAGNOSIS — R700 Elevated erythrocyte sedimentation rate: Secondary | ICD-10-CM | POA: Diagnosis present

## 2019-01-24 DIAGNOSIS — M25551 Pain in right hip: Secondary | ICD-10-CM

## 2019-01-24 DIAGNOSIS — M545 Low back pain, unspecified: Secondary | ICD-10-CM | POA: Insufficient documentation

## 2019-01-24 DIAGNOSIS — M25552 Pain in left hip: Secondary | ICD-10-CM | POA: Diagnosis present

## 2019-01-24 DIAGNOSIS — Z8661 Personal history of infections of the central nervous system: Secondary | ICD-10-CM | POA: Diagnosis present

## 2019-01-24 DIAGNOSIS — M79604 Pain in right leg: Secondary | ICD-10-CM | POA: Diagnosis not present

## 2019-01-24 DIAGNOSIS — M25512 Pain in left shoulder: Secondary | ICD-10-CM | POA: Diagnosis present

## 2019-01-24 DIAGNOSIS — Z8739 Personal history of other diseases of the musculoskeletal system and connective tissue: Secondary | ICD-10-CM | POA: Insufficient documentation

## 2019-01-24 DIAGNOSIS — M961 Postlaminectomy syndrome, not elsewhere classified: Secondary | ICD-10-CM | POA: Insufficient documentation

## 2019-01-24 DIAGNOSIS — M79602 Pain in left arm: Secondary | ICD-10-CM | POA: Insufficient documentation

## 2019-01-24 DIAGNOSIS — R937 Abnormal findings on diagnostic imaging of other parts of musculoskeletal system: Secondary | ICD-10-CM | POA: Diagnosis present

## 2019-01-24 DIAGNOSIS — M79601 Pain in right arm: Secondary | ICD-10-CM | POA: Insufficient documentation

## 2019-01-24 DIAGNOSIS — R7982 Elevated C-reactive protein (CRP): Secondary | ICD-10-CM | POA: Diagnosis present

## 2019-01-24 DIAGNOSIS — R1031 Right lower quadrant pain: Secondary | ICD-10-CM | POA: Diagnosis not present

## 2019-01-24 DIAGNOSIS — G8929 Other chronic pain: Secondary | ICD-10-CM | POA: Diagnosis present

## 2019-01-24 DIAGNOSIS — M1611 Unilateral primary osteoarthritis, right hip: Secondary | ICD-10-CM | POA: Diagnosis present

## 2019-01-24 DIAGNOSIS — M25511 Pain in right shoulder: Secondary | ICD-10-CM | POA: Diagnosis present

## 2019-01-24 NOTE — Patient Instructions (Signed)
____________________________________________________________________________________________  Preparing for Procedure with Sedation  Instructions: . Oral Intake: Do not eat or drink anything for at least 8 hours prior to your procedure. . Transportation: Public transportation is not allowed. Bring an adult driver. The driver must be physically present in our waiting room before any procedure can be started. . Physical Assistance: Bring an adult physically capable of assisting you, in the event you need help. This adult should keep you company at home for at least 6 hours after the procedure. . Blood Pressure Medicine: Take your blood pressure medicine with a sip of water the morning of the procedure. . Blood thinners: Notify our staff if you are taking any blood thinners. Depending on which one you take, there will be specific instructions on how and when to stop it. . Diabetics on insulin: Notify the staff so that you can be scheduled 1st case in the morning. If your diabetes requires high dose insulin, take only  of your normal insulin dose the morning of the procedure and notify the staff that you have done so. . Preventing infections: Shower with an antibacterial soap the morning of your procedure. . Build-up your immune system: Take 1000 mg of Vitamin C with every meal (3 times a day) the day prior to your procedure. . Antibiotics: Inform the staff if you have a condition or reason that requires you to take antibiotics before dental procedures. . Pregnancy: If you are pregnant, call and cancel the procedure. . Sickness: If you have a cold, fever, or any active infections, call and cancel the procedure. . Arrival: You must be in the facility at least 30 minutes prior to your scheduled procedure. . Children: Do not bring children with you. . Dress appropriately: Bring dark clothing that you would not mind if they get stained. . Valuables: Do not bring any jewelry or valuables.  Procedure  appointments are reserved for interventional treatments only. . No Prescription Refills. . No medication changes will be discussed during procedure appointments. . No disability issues will be discussed.  Reasons to call and reschedule or cancel your procedure: (Following these recommendations will minimize the risk of a serious complication.) . Surgeries: Avoid having procedures within 2 weeks of any surgery. (Avoid for 2 weeks before or after any surgery). . Flu Shots: Avoid having procedures within 2 weeks of a flu shots or . (Avoid for 2 weeks before or after immunizations). . Barium: Avoid having a procedure within 7-10 days after having had a radiological study involving the use of radiological contrast. (Myelograms, Barium swallow or enema study). . Heart attacks: Avoid any elective procedures or surgeries for the initial 6 months after a "Myocardial Infarction" (Heart Attack). . Blood thinners: It is imperative that you stop these medications before procedures. Let us know if you if you take any blood thinner.  . Infection: Avoid procedures during or within two weeks of an infection (including chest colds or gastrointestinal problems). Symptoms associated with infections include: Localized redness, fever, chills, night sweats or profuse sweating, burning sensation when voiding, cough, congestion, stuffiness, runny nose, sore throat, diarrhea, nausea, vomiting, cold or Flu symptoms, recent or current infections. It is specially important if the infection is over the area that we intend to treat. . Heart and lung problems: Symptoms that may suggest an active cardiopulmonary problem include: cough, chest pain, breathing difficulties or shortness of breath, dizziness, ankle swelling, uncontrolled high or unusually low blood pressure, and/or palpitations. If you are experiencing any of these symptoms, cancel   your procedure and contact your primary care physician for an evaluation.  Remember:   Regular Business hours are:  Monday to Thursday 8:00 AM to 4:00 PM  Provider's Schedule: Joi Leyva, MD:  Procedure days: Tuesday and Thursday 7:30 AM to 4:00 PM  Bilal Lateef, MD:  Procedure days: Monday and Wednesday 7:30 AM to 4:00 PM ____________________________________________________________________________________________    

## 2019-01-24 NOTE — Progress Notes (Signed)
Safety precautions to be maintained throughout the outpatient stay will include: orient to surroundings, keep bed in low position, maintain call bell within reach at all times, provide assistance with transfer out of bed and ambulation.  

## 2019-03-14 ENCOUNTER — Ambulatory Visit: Payer: Medicare Other | Admitting: Pain Medicine

## 2019-07-24 ENCOUNTER — Other Ambulatory Visit: Payer: Self-pay

## 2019-07-24 ENCOUNTER — Encounter: Payer: Self-pay | Admitting: Pain Medicine

## 2019-07-24 ENCOUNTER — Ambulatory Visit (HOSPITAL_BASED_OUTPATIENT_CLINIC_OR_DEPARTMENT_OTHER): Payer: Medicare Other | Admitting: Pain Medicine

## 2019-07-24 ENCOUNTER — Encounter (INDEPENDENT_AMBULATORY_CARE_PROVIDER_SITE_OTHER): Payer: Self-pay

## 2019-07-24 ENCOUNTER — Ambulatory Visit
Admission: RE | Admit: 2019-07-24 | Discharge: 2019-07-24 | Disposition: A | Discharge status: Home / Self Care | Payer: Medicare Other | Source: Ambulatory Visit | Attending: Pain Medicine | Admitting: Pain Medicine

## 2019-07-24 VITALS — BP 127/59 | HR 61 | Temp 98.3°F | Resp 18 | Ht 68.0 in | Wt 153.0 lb

## 2019-07-24 DIAGNOSIS — R937 Abnormal findings on diagnostic imaging of other parts of musculoskeletal system: Secondary | ICD-10-CM | POA: Diagnosis present

## 2019-07-24 DIAGNOSIS — G8929 Other chronic pain: Secondary | ICD-10-CM | POA: Diagnosis present

## 2019-07-24 DIAGNOSIS — M1611 Unilateral primary osteoarthritis, right hip: Secondary | ICD-10-CM | POA: Insufficient documentation

## 2019-07-24 DIAGNOSIS — M48061 Spinal stenosis, lumbar region without neurogenic claudication: Secondary | ICD-10-CM | POA: Diagnosis present

## 2019-07-24 DIAGNOSIS — R1031 Right lower quadrant pain: Secondary | ICD-10-CM | POA: Diagnosis present

## 2019-07-24 DIAGNOSIS — M48062 Spinal stenosis, lumbar region with neurogenic claudication: Secondary | ICD-10-CM | POA: Insufficient documentation

## 2019-07-24 DIAGNOSIS — M25552 Pain in left hip: Secondary | ICD-10-CM | POA: Insufficient documentation

## 2019-07-24 DIAGNOSIS — M4807 Spinal stenosis, lumbosacral region: Secondary | ICD-10-CM | POA: Diagnosis present

## 2019-07-24 DIAGNOSIS — M25551 Pain in right hip: Secondary | ICD-10-CM | POA: Diagnosis present

## 2019-07-24 MED ORDER — LIDOCAINE HCL 2 % IJ SOLN
INTRAMUSCULAR | Status: AC
  Filled 2019-07-24: qty 20

## 2019-07-24 MED ORDER — ROPIVACAINE HCL 2 MG/ML IJ SOLN
9.0000 mL | Freq: Once | INTRAMUSCULAR | Status: AC
  Administered 2019-07-24: 13:00:00 9 mL via INTRA_ARTICULAR

## 2019-07-24 MED ORDER — METHYLPREDNISOLONE ACETATE 80 MG/ML IJ SUSP
INTRAMUSCULAR | Status: AC
  Filled 2019-07-24: qty 1

## 2019-07-24 MED ORDER — ROPIVACAINE HCL 2 MG/ML IJ SOLN
INTRAMUSCULAR | Status: AC
  Filled 2019-07-24: qty 10

## 2019-07-24 MED ORDER — LIDOCAINE HCL 2 % IJ SOLN
20.0000 mL | Freq: Once | INTRAMUSCULAR | Status: AC
  Administered 2019-07-24: 13:00:00 400 mg

## 2019-07-24 MED ORDER — METHYLPREDNISOLONE ACETATE 80 MG/ML IJ SUSP
80.0000 mg | Freq: Once | INTRAMUSCULAR | Status: AC
  Administered 2019-07-24: 80 mg via INTRA_ARTICULAR

## 2019-07-24 MED ORDER — IOHEXOL 180 MG/ML  SOLN
10.0000 mL | Freq: Once | INTRAMUSCULAR | Status: AC
  Administered 2019-07-24: 13:00:00 10 mL via INTRA_ARTICULAR

## 2019-07-24 NOTE — Patient Instructions (Signed)

## 2019-07-24 NOTE — Progress Notes (Signed)
Safety precautions to be maintained throughout the outpatient stay will include: orient to surroundings, keep bed in low position, maintain call bell within reach at all times, provide assistance with transfer out of bed and ambulation.  

## 2019-07-24 NOTE — Progress Notes (Addendum)
Patient's Name: Arthur Wilcox  MRN: [id number]  Referring Provider: Milinda Pointer, MD  DOB: 1951-08-29  PCP: [facility] [place]  DOS: 07/24/2019  Note by: Gaspar Cola, MD  Service setting: Ambulatory outpatient  Specialty: Interventional Pain Management  Patient type: Established  Location: [facility] (AMB) Pain Management Facility  Visit type: Interventional Procedure   Primary Reason for Visit: Interventional Pain Management Treatment. CC: Hip Pain (right)  Procedure:          Anesthesia, Analgesia, Anxiolysis:  Type: Intra-Articular Hip Injection #2  Primary Purpose: Diagnostic/Therapeutric Region: Posterolateral hip joint area. Level: Lower pelvic and hip joint level. Target Area: Superior aspect of the hip joint cavity, going thru the superior portion of the capsular ligament. Approach: Posterolateral approach. Laterality: Right-Sided  Type: Local Anesthesia Indication(s): Analgesia         Route: Infiltration ([place]/IM) IV Access: Declined Sedation: Declined  Local Anesthetic: Lidocaine 1-2%  Position: Left Lateral Decubitus. Prepped Area: Entire Posterolateral hip area. Prepping solution: DuraPrep (Iodine Povacrylex [0.7% available iodine] and Isopropyl Alcohol, 74% w/w)   Indications: 1. Osteoarthritis of hip (Right)   2. Chronic hip pain (Bilateral (R>L)   3. Chronic groin pain (Right)    Pain Score: Pre-procedure: 7 /10 Post-procedure: 2 /10   Pre-op Assessment:  Mr. Dubberly is a 68 y.o. (year old), male patient, seen today for interventional treatment. He  has a past surgical history that includes Posterior cervical laminectomy (04/18/2012); Lumbar laminectomy/decompression microdiscectomy (05/29/2012); and Lumbar wound debridement (06/19/2012). Mr. Linford has a current medication list which includes the following prescription(s): atorvastatin, calcium carbonate, cyclobenzaprine, meloxicam, naproxen sodium, and sildenafil. His primarily concern today is the Hip  Pain (right)  Initial Vital Signs:  Pulse/HCG Rate: 67ECG Heart Rate: 63 Temp: 98.3 F (36.8 C) Resp: 16 BP: (!) 163/84 SpO2: 98 %  BMI: Estimated body mass index is 23.26 kg/m as calculated from the following:   Height as of this encounter: 5\' 8"  (1.727 m).   Weight as of this encounter: 153 lb (69.4 kg).  Risk Assessment: Allergies: Reviewed. He has No Known Allergies.  Allergy Precautions: None required Coagulopathies: Reviewed. None identified.  Blood-thinner therapy: None at this time Active Infection(s): Reviewed. None identified. Mr. Mach is afebrile  Site Confirmation: Mr. Caraway was asked to confirm the procedure and laterality before marking the site Procedure checklist: Completed Consent: Before the procedure and under the influence of no sedative(s), amnesic(s), or anxiolytics, the patient was informed of the treatment options, risks and possible complications. To fulfill our ethical and legal obligations, as recommended by the American Medical Association's Code of Ethics, I have informed the patient of my clinical impression; the nature and purpose of the treatment or procedure; the risks, benefits, and possible complications of the intervention; the alternatives, including doing nothing; the risk(s) and benefit(s) of the alternative treatment(s) or procedure(s); and the risk(s) and benefit(s) of doing nothing. The patient was provided information about the general risks and possible complications associated with the procedure. These may include, but are not limited to: failure to achieve desired goals, infection, bleeding, organ or nerve damage, allergic reactions, paralysis, and death. In addition, the patient was informed of those risks and complications associated to the procedure, such as failure to decrease pain; infection; bleeding; organ or nerve damage with subsequent damage to sensory, motor, and/or autonomic systems, resulting in permanent pain, numbness, and/or  weakness of one or several areas of the body; allergic reactions; (i.e.: anaphylactic reaction); and/or death. Furthermore, the patient was  informed of those risks and complications associated with the medications. These include, but are not limited to: allergic reactions (i.e.: anaphylactic or anaphylactoid reaction(s)); adrenal axis suppression; blood sugar elevation that in diabetics may result in ketoacidosis or comma; water retention that in patients with history of congestive heart failure may result in shortness of breath, pulmonary edema, and decompensation with resultant heart failure; weight gain; swelling or edema; medication-induced neural toxicity; particulate matter embolism and blood vessel occlusion with resultant organ, and/or nervous system infarction; and/or aseptic necrosis of one or more joints. Finally, the patient was informed that Medicine is not an exact science; therefore, there is also the possibility of unforeseen or unpredictable risks and/or possible complications that may result in a catastrophic outcome. The patient indicated having understood very clearly. We have given the patient no guarantees and we have made no promises. Enough time was given to the patient to ask questions, all of which were answered to the patient's satisfaction. Mr. Sandria ManlyLove has indicated that he wanted to continue with the procedure. Attestation: I, the ordering provider, attest that I have discussed with the patient the benefits, risks, side-effects, alternatives, likelihood of achieving goals, and potential problems during recovery for the procedure that I have provided informed consent. Date  Time: 07/24/2019 12:42 PM  Pre-Procedure Preparation:  Monitoring: As per clinic protocol. Respiration, ETCO2, SpO2, BP, heart rate and rhythm monitor placed and checked for adequate function Safety Precautions: Patient was assessed for positional comfort and pressure points before starting the  procedure. Time-out: I initiated and conducted the "Time-out" before starting the procedure, as per protocol. The patient was asked to participate by confirming the accuracy of the "Time Out" information. Verification of the correct person, site, and procedure were performed and confirmed by me, the nursing staff, and the patient. "Time-out" conducted as per Joint Commission's Universal Protocol (UP.01.01.01). Time: 1321  Description of Procedure:          Safety Precautions: Aspiration looking for blood return was conducted prior to all injections. At no point did we inject any substances, as a needle was being advanced. No attempts were made at seeking any paresthesias. Safe injection practices and needle disposal techniques used. Medications properly checked for expiration dates. SDV (single dose vial) medications used. Description of the Procedure: Protocol guidelines were followed. The patient was placed in position over the fluoroscopy table. The target area was identified and the area prepped in the usual manner. Skin & deeper tissues infiltrated with local anesthetic. Appropriate amount of time allowed to pass for local anesthetics to take effect. The procedure needles were then advanced to the target area. Proper needle placement secured. Negative aspiration confirmed. Solution injected in intermittent fashion, asking for systemic symptoms every 0.5cc of injectate. The needles were then removed and the area cleansed, making sure to leave some of the prepping solution back to take advantage of its long term bactericidal properties. Vitals:   07/24/19 1315 07/24/19 1322 07/24/19 1327 07/24/19 1329  BP: (!) 109/53 (!) 121/56 (!) 122/57 (!) 127/59  Pulse: 61     Resp: 15 16 18 18   Temp:      SpO2: 99% 99% 99% 99%  Weight:      Height:        Start Time: 1321 hrs. End Time: 1329 hrs. Materials:  Needle(s) Type: Spinal Needle Gauge: 22G Length: 5.0-in Medication(s): Please see orders for  medications and dosing details.  Imaging Guidance (Non-Spinal):          Type  of Imaging Technique: Fluoroscopy Guidance (Non-Spinal) Indication(s): Assistance in needle guidance and placement for procedures requiring needle placement in or near specific anatomical locations not easily accessible without such assistance. Exposure Time: Please see nurses notes. Contrast: Before injecting any contrast, we confirmed that the patient did not have an allergy to iodine, shellfish, or radiological contrast. Once satisfactory needle placement was completed at the desired level, radiological contrast was injected. Contrast injected under live fluoroscopy. No contrast complications. See chart for type and volume of contrast used. Fluoroscopic Guidance: I was personally present during the use of fluoroscopy. "Tunnel Vision Technique" used to obtain the best possible view of the target area. Parallax error corrected before commencing the procedure. "Direction-depth-direction" technique used to introduce the needle under continuous pulsed fluoroscopy. Once target was reached, antero-posterior, oblique, and lateral fluoroscopic projection used confirm needle placement in all planes. Images permanently stored in EMR. Interpretation: I personally interpreted the imaging intraoperatively. Adequate needle placement confirmed in multiple planes. Appropriate spread of contrast into desired area was observed. No evidence of afferent or efferent intravascular uptake. Permanent images saved into the patient's record.  Antibiotic Prophylaxis:   Anti-infectives (From admission, onward)   None     Indication(s): None identified  Post-operative Assessment:  Post-procedure Vital Signs:  Pulse/HCG Rate: 6160 Temp: 98.3 F (36.8 C) Resp: 18 BP: (!) 127/59 SpO2: 99 %  EBL: None  Complications: No immediate post-treatment complications observed by team, or reported by patient.  Note: The patient tolerated the entire  procedure well. A repeat set of vitals were taken after the procedure and the patient was kept under observation following institutional policy, for this type of procedure. Post-procedural neurological assessment was performed, showing return to baseline, prior to discharge. The patient was provided with post-procedure discharge instructions, including a section on how to identify potential problems. Should any problems arise concerning this procedure, the patient was given instructions to immediately contact us, at any time, without hesitation. In any case, we plan to contact the patient by telephone for a follow-up status report regarding this interventional procedure.  Comments:  No additional relevant information.  Plan of Care  Orders:  Orders Placed This Encounter  Procedures  . HIP INJECTION    Scheduling Instructions:     Side: Right-sided     Sedation: No Sedation.     Timeframe: Today  . DG PAIN CLINIC C-ARM 1-60 MIN NO REPORT    Intraoperative interpretation by procedural physician at [facility].    Standing Status:   Standing    Number of Occurrences:   1    Order Specific Question:   Reason for exam:    Answer:   Assistance in needle guidance and placement for procedures requiring needle placement in or near specific anatomical locations not easily accessible without such assistance.  . Provider attestation of informed consent for procedure/surgical case    I, the ordering provider, attest that I have discussed with the patient the benefits, risks, side effects, alternatives, likelihood of achieving goals and potential problems during recovery for the procedure that I have provided informed consent.    Standing Status:   Standing    Number of Occurrences:   1  . Informed Consent Details: Transcribe to consent form and obtain patient signature    Consent Attestation: I, the ordering provider, attest that I have discussed with the patient the benefits, risks,  side-effects, alternatives, likelihood of achieving goals, and potential problems during recovery for the procedure that I have provided  informed consent.    Standing Status:   Standing    Number of Occurrences:   1    Order Specific Question:   Procedure    Answer:   Right-sided intra-articular hip joint injection under fluoroscopic guidance    Order Specific Question:   Surgeon    Answer:   Saniyya Gau A. Laban EmperorNaveira, MD    Order Specific Question:   Indication/Reason    Answer:   Right hip pain secondary to osteoarthritis   Chronic Opioid Analgesic:  No opioid analgesics from our practice.    Medications ordered for procedure: Meds ordered this encounter  Medications  . iohexol (OMNIPAQUE) 180 MG/ML injection 10 mL    Must be Myelogram-compatible. If not available, you may substitute with a water-soluble, non-ionic, hypoallergenic, myelogram-compatible radiological contrast medium.  Marland Kitchen. lidocaine (XYLOCAINE) 2 % (with pres) injection 400 mg  . ropivacaine (PF) 2 mg/mL (0.2%) (NAROPIN) injection 9 mL  . methylPREDNISolone acetate (DEPO-MEDROL) injection 80 mg   Medications administered: We administered iohexol, lidocaine, ropivacaine (PF) 2 mg/mL (0.2%), and methylPREDNISolone acetate.  See the medical record for exact dosing, route, and time of administration.  Follow-up plan:   Return for (VV), 2 wk PP-F/U Eval.       Interventional management options:  Considering:   Diagnostic caudal ESI + diagnostic epidurogram  Possible Racz procedure  Diagnostic bilateral L3,L4, & L5 TFESI  Diagnostic right L4 TFESI  Diagnostic left L5 TFESI  Diagnostic bilateral sacroiliac joint block  Possible bilateral sacroiliac joint RFA  Diagnostic bilateral lumbar facet block  Possible bilateral lumbar facet RFA  Diagnostic bilateral IA shoulder injection Diagnostic bilateral suprascapular NB Diagnostic bilateral IA hand joint injection   Palliative PRN treatment(s):   Diagnostic right IA Hip  injection #3under fluoro and no sedation.    Recent Visits No visits were found meeting these conditions.  Showing recent visits within past 90 days and meeting all other requirements   Today's Visits Date Type Provider Dept  07/24/19 Procedure visit Delano MetzNaveira, Jermon Chalfant, MD [facility]  Showing today's visits and meeting all other requirements   Future Appointments Date Type Provider Dept  08/22/19 Appointment Delano MetzNaveira, Yaileen Hofferber, MD [facility]  Showing future appointments within next 90 days and meeting all other requirements   Disposition: Discharge home  Discharge Date & Time: 07/24/2019; 1345 hrs.   Primary Care Physician: [facility] Note by: Oswaldo DoneFrancisco A Claudis Giovanelli, MD Date: 07/24/2019; Time: 2:26 PM  Disclaimer:  Medicine is not an Visual merchandiserexact science. The only guarantee in medicine is that nothing is guaranteed. It is important to note that the decision to proceed with this intervention was based on the information collected from the patient. The Data and conclusions were drawn from the patient's questionnaire, the interview, and the physical examination. Because the information was provided in large part by the patient, it cannot be guaranteed that it has not been purposely or unconsciously manipulated. Every effort has been made to obtain as much relevant data as possible for this evaluation. It is important to note that the conclusions that lead to this procedure are derived in large part from the available data. Always take into account that the treatment will also be dependent on availability of resources and existing treatment guidelines, considered by other Pain Management Practitioners as being common knowledge and practice, at the time of the intervention. For Medico-Legal purposes, it is also important to point out that variation in procedural techniques and pharmacological  choices are the  acceptable norm. The indications, contraindications, technique, and results of the above procedure should only be interpreted and judged by a Board-Certified Interventional Pain Specialist with extensive familiarity and expertise in the same exact procedure and technique.

## 2019-07-25 ENCOUNTER — Telehealth: Payer: Self-pay

## 2019-07-25 NOTE — Telephone Encounter (Signed)
Post procedure phone call.  LM 

## 2019-08-20 ENCOUNTER — Encounter: Payer: Self-pay | Admitting: Pain Medicine

## 2019-08-21 NOTE — Progress Notes (Deleted)
Pain Management Virtual Encounter Note - Virtual Visit via Telephone Telehealth (real-time audio visits between healthcare provider and patient).   Patient's Phone No. & Preferred Pharmacy:  [phone] (home); There is no such number on file (mobile).; (Preferred) [phone] No e-mail address on record  [facility] - [place], [place] - [address] [place] [address] Phone: [phone] Fax: [phone]    Pre-screening note:  Our staff contacted Mr. Arthur Wilcox and offered him an "in person", "face-to-face" appointment versus a telephone encounter. He indicated preferring the telephone encounter, at this time.   Reason for Virtual Visit: COVID-19*  Social distancing based on CDC and AMA recommendations.   I contacted Arthur Wilcox on 08/22/2019 via telephone.      I clearly identified myself as Oswaldo Done, MD. I verified that I was speaking with the correct person using two identifiers (Name: Arthur Wilcox, and date of birth: [birth year]).  Advanced Informed Consent I sought verbal advanced consent from Arthur Wilcox for virtual visit interactions. I informed Arthur Wilcox of possible security and privacy concerns, risks, and limitations associated with providing "not-in-person" medical evaluation and management services. I also informed Arthur Wilcox of the availability of "in-person" appointments. Finally, I informed him that there would be a charge for the virtual visit and that he could be  personally, fully or partially, financially responsible for it. Arthur Wilcox expressed understanding and agreed to proceed.   Historic Elements   Arthur Wilcox is a 68 y.o. year old, male patient evaluated today after his last encounter by our practice on 07/25/2019. Arthur Wilcox  has a past medical history of Anxiety, Chronic back pain, Chronic back pain, Chronic neck pain, Depression, Essential hypertension, benign, Headache(784.0), History of foot fracture, Hyperlipidemia, Joint  disease, Migraine, Osteoarthritis, Seizures (HCC), and Shortness of breath. He also  has a past surgical history that includes Posterior cervical laminectomy (04/18/2012); Lumbar laminectomy/decompression microdiscectomy (05/29/2012); and Lumbar wound debridement (06/19/2012). Arthur Wilcox has a current medication list which includes the following prescription(s): atorvastatin, calcium carbonate, cyclobenzaprine, meloxicam, naproxen sodium, and sildenafil. He  reports that he has been smoking cigarettes and cigars. He has a 22.00 pack-year smoking history. He has never used smokeless tobacco. He reports current alcohol use of about 1.0 standard drinks of alcohol per week. He reports current drug use. Drug: Marijuana. Arthur Wilcox has No Known Allergies.   HPI  Today, he is being contacted for a post-procedure assessment.  Post-Procedure Evaluation  Procedure: *** Pre-procedure pain level:   *** /10 Post-procedure:  *** /10          Sedation: Please see nurses note.  Effectiveness during initial hour after procedure(Ultra-Short Term Relief):     Local anesthetic used: Long-acting (4-6 hours) Effectiveness: Defined as any analgesic benefit obtained secondary to the administration of local anesthetics. This carries significant diagnostic value as to the etiological location, or anatomical origin, of the pain. Duration of benefit is expected to coincide with the duration of the local anesthetic used.  Effectiveness during initial 4-6 hours after procedure(Short-Term Relief):     Long-term benefit: Defined as any relief past the pharmacologic duration of the local anesthetics.  Effectiveness past the initial 6 hours after procedure(Long-Term Relief):     Current benefits: Defined as benefit that persist at this time.   Analgesia:   ***  Function:  ***  ROM:  ***   Pharmacotherapy Assessment  Analgesic: No opioid analgesics from our practice.    Monitoring: Pharmacotherapy: No side-effects  or adverse  reactions reported. [place] PMP: PDMP reviewed during this encounter.       Compliance: No problems identified. Effectiveness: Clinically acceptable. Plan: Refer to "POC".  UDS:  Summary  Date Value Ref Range Status  12/07/2018 FINAL  Final    Comment:    ==================================================================== TOXASSURE COMP DRUG ANALYSIS,UR ==================================================================== Test                             Result       Flag       Units Drug Present and Declared for Prescription Verification   Desmethylcyclobenzaprine       PRESENT      EXPECTED    Desmethylcyclobenzaprine is an expected metabolite of    cyclobenzaprine. Drug Present not Declared for Prescription Verification   Benzoylecgonine                >2618        UNEXPECTED ng/mg creat    Benzoylecgonine is a metabolite of cocaine; its presence    indicates use of this drug.  Source is most commonly illicit, but    cocaine is present in some topical anesthetic solutions.   Carboxy-THC                    7            UNEXPECTED ng/mg creat    Carboxy-THC is a metabolite of tetrahydrocannabinol  (THC).    Source of [facility] is most commonly illicit, but THC is also present    in a scheduled prescription medication. ==================================================================== Test                      Result    Flag   Units      Ref Range   Creatinine              191              mg/dL      >=16>=20 ==================================================================== Declared Medications:  The flagging and interpretation on this report are based on the  following declared medications.  Unexpected results may arise from  inaccuracies in the declared medications.  **Note: The testing scope of this panel includes these medications:  Cyclobenzaprine (Flexeril)  **Note: The testing scope of this panel does not include following  reported medications:  Atorvastatin (Lipitor)   Meloxicam (Mobic)  Sildenafil (Viagra) ==================================================================== For clinical consultation, please call [phone]. ====================================================================    Laboratory Chemistry Profile (12 mo)  Renal: 12/07/2018: BUN 16; Creatinine, Ser 1.09  Lab Results  Component Value Date   GFRAA >60 12/07/2018   GFRNONAA >60 12/07/2018   Hepatic: 12/07/2018: Albumin 4.4 Lab Results  Component Value Date   AST 24 12/07/2018   ALT 16 12/07/2018   Other: 12/07/2018: CRP 3.4; Sed Rate 32; Vit D, 25-Hydroxy 13.7; Vitamin B-12 417 Note: Above Lab results reviewed.  Imaging  Last 90 days:  Dg Pain Clinic C-arm 1-60 Min No Report  Result Date: 07/24/2019 Fluoro was used, but no Radiologist interpretation will be provided. Please refer to "NOTES" tab for provider progress note.   Assessment  The primary encounter diagnosis was Chronic pain syndrome. Diagnoses of Chronic low back pain (Primary area of Pain) (Bilateral) (R>L) w/o sciatica, Chronic lower extremity pain (Secondary Area of Pain) (Right), Chronic shoulder pain (Tertiary Area of Pain) (Bilateral) (R>L), Chronic upper extremity pain (Fourth Area of  Pain) (Bilateral) (R>L), and History of CNS infection (Epidural Abscess) were also pertinent to this visit.  Plan of Care  I am having Arthur Wilcox maintain his cyclobenzaprine, atorvastatin, meloxicam, sildenafil, naproxen sodium, and calcium carbonate.  Pharmacotherapy (Medications Ordered): No orders of the defined types were placed in this encounter.  Orders:  No orders of the defined types were placed in this encounter.  Follow-up plan:   No follow-ups on file.      Interventional management options:  Considering:   Diagnostic caudal ESI + diagnostic epidurogram  Possible Racz procedure  Diagnostic bilateral L3,L4, & L5 TFESI  Diagnostic right L4 TFESI  Diagnostic left L5 TFESI  Diagnostic bilateral  sacroiliac joint block  Possible bilateral sacroiliac joint RFA  Diagnostic bilateral lumbar facet block  Possible bilateral lumbar facet RFA  Diagnostic bilateral IA shoulder injection Diagnostic bilateral suprascapular NB Diagnostic bilateral IA hand joint injection   Palliative PRN treatment(s):   Diagnostic right IA Hip injection #3under fluoro and no sedation.     Recent Visits Date Type Provider Dept  07/24/19 Procedure visit Milinda Pointer, MD [facility]  Showing recent visits within past 90 days and meeting all other requirements   Future Appointments Date Type Provider Dept  08/22/19 Office Visit Milinda Pointer, MD [facility]  Showing future appointments within next 90 days and meeting all other requirements   I discussed the assessment and treatment plan with the patient. The patient was provided an opportunity to ask questions and all were answered. The patient agreed with the plan and demonstrated an understanding of the instructions.  Patient advised to call back or seek an in-person evaluation if the symptoms or condition worsens.  Total duration of non-face-to-face encounter: *** minutes.  Note by: Gaspar Cola, MD Date: 08/22/2019; Time: 7:29 PM  Note: This dictation was prepared with Dragon dictation. Any transcriptional errors that may result from this process are unintentional.  Disclaimer:  * Given the special circumstances of the COVID-19 pandemic, the federal government has announced that the Office for Civil Rights (OCR) will exercise its enforcement discretion and will not impose penalties on physicians using telehealth in the event of noncompliance with regulatory requirements under the [place] (HIPAA) in connection with the good faith provision of telehealth during the XTGGY-69 national public health emergency. ([place])

## 2019-08-22 ENCOUNTER — Ambulatory Visit (HOSPITAL_BASED_OUTPATIENT_CLINIC_OR_DEPARTMENT_OTHER): Payer: Medicare Other | Admitting: Pain Medicine

## 2019-08-22 ENCOUNTER — Other Ambulatory Visit: Payer: Self-pay

## 2019-08-22 DIAGNOSIS — M545 Low back pain: Secondary | ICD-10-CM

## 2019-08-22 DIAGNOSIS — G8929 Other chronic pain: Secondary | ICD-10-CM

## 2019-08-22 DIAGNOSIS — G894 Chronic pain syndrome: Secondary | ICD-10-CM

## 2019-08-22 DIAGNOSIS — Z8661 Personal history of infections of the central nervous system: Secondary | ICD-10-CM

## 2019-08-22 DIAGNOSIS — M79601 Pain in right arm: Secondary | ICD-10-CM

## 2019-08-22 DIAGNOSIS — M25511 Pain in right shoulder: Secondary | ICD-10-CM

## 2019-08-22 DIAGNOSIS — M79602 Pain in left arm: Secondary | ICD-10-CM

## 2019-08-22 DIAGNOSIS — M79604 Pain in right leg: Secondary | ICD-10-CM

## 2019-08-22 DIAGNOSIS — M25512 Pain in left shoulder: Secondary | ICD-10-CM

## 2019-08-22 MED ORDER — QUINERVA 260 MG PO TABS
975.00 | ORAL_TABLET | ORAL | Status: DC
Start: ? — End: 2019-08-22

## 2019-08-22 MED ORDER — AEROBID-M IN
800.00 | RESPIRATORY_TRACT | Status: DC
Start: ? — End: 2019-08-22

## 2020-08-17 ENCOUNTER — Emergency Department
Admission: EM | Admit: 2020-08-17 | Discharge: 2020-08-17 | Disposition: A | Discharge status: Home / Self Care | Payer: Medicare Other | Attending: Emergency Medicine | Admitting: Emergency Medicine

## 2020-08-17 ENCOUNTER — Emergency Department: Payer: Medicare Other

## 2020-08-17 ENCOUNTER — Encounter: Payer: Self-pay | Admitting: Emergency Medicine

## 2020-08-17 ENCOUNTER — Other Ambulatory Visit: Payer: Self-pay

## 2020-08-17 DIAGNOSIS — F17210 Nicotine dependence, cigarettes, uncomplicated: Secondary | ICD-10-CM | POA: Diagnosis not present

## 2020-08-17 DIAGNOSIS — Z79899 Other long term (current) drug therapy: Secondary | ICD-10-CM | POA: Diagnosis not present

## 2020-08-17 DIAGNOSIS — R110 Nausea: Secondary | ICD-10-CM | POA: Diagnosis not present

## 2020-08-17 DIAGNOSIS — R109 Unspecified abdominal pain: Secondary | ICD-10-CM | POA: Insufficient documentation

## 2020-08-17 DIAGNOSIS — R569 Unspecified convulsions: Secondary | ICD-10-CM | POA: Diagnosis not present

## 2020-08-17 DIAGNOSIS — F17290 Nicotine dependence, other tobacco product, uncomplicated: Secondary | ICD-10-CM | POA: Insufficient documentation

## 2020-08-17 DIAGNOSIS — F10920 Alcohol use, unspecified with intoxication, uncomplicated: Secondary | ICD-10-CM

## 2020-08-17 DIAGNOSIS — I10 Essential (primary) hypertension: Secondary | ICD-10-CM | POA: Diagnosis not present

## 2020-08-17 DIAGNOSIS — F10129 Alcohol abuse with intoxication, unspecified: Secondary | ICD-10-CM | POA: Insufficient documentation

## 2020-08-17 DIAGNOSIS — F1590 Other stimulant use, unspecified, uncomplicated: Secondary | ICD-10-CM | POA: Insufficient documentation

## 2020-08-17 LAB — URINE DRUG SCREEN, QUALITATIVE (ARMC ONLY)
Amphetamines, Ur Screen: NOT DETECTED
Barbiturates, Ur Screen: NOT DETECTED
Benzodiazepine, Ur Scrn: NOT DETECTED
Cannabinoid 50 Ng, Ur ~~LOC~~: POSITIVE — AB
Cocaine Metabolite,Ur ~~LOC~~: POSITIVE — AB
MDMA (Ecstasy)Ur Screen: NOT DETECTED
Methadone Scn, Ur: NOT DETECTED
Opiate, Ur Screen: NOT DETECTED
Phencyclidine (PCP) Ur S: NOT DETECTED
Tricyclic, Ur Screen: NOT DETECTED

## 2020-08-17 LAB — CBC WITH DIFFERENTIAL/PLATELET
Abs Immature Granulocytes: 0.05 10*3/uL (ref 0.00–0.07)
Basophils Absolute: 0 10*3/uL (ref 0.0–0.1)
Basophils Relative: 0 %
Eosinophils Absolute: 0 10*3/uL (ref 0.0–0.5)
Eosinophils Relative: 0 %
HCT: 41.6 % (ref 39.0–52.0)
Hemoglobin: 13.8 g/dL (ref 13.0–17.0)
Immature Granulocytes: 1 %
Lymphocytes Relative: 17 %
Lymphs Abs: 1.5 10*3/uL (ref 0.7–4.0)
MCH: 29.7 pg (ref 26.0–34.0)
MCHC: 33.2 g/dL (ref 30.0–36.0)
MCV: 89.5 fL (ref 80.0–100.0)
Monocytes Absolute: 0.5 10*3/uL (ref 0.1–1.0)
Monocytes Relative: 5 %
Neutro Abs: 7.1 10*3/uL (ref 1.7–7.7)
Neutrophils Relative %: 77 %
Platelets: 223 10*3/uL (ref 150–400)
RBC: 4.65 MIL/uL (ref 4.22–5.81)
RDW: 14.3 % (ref 11.5–15.5)
WBC: 9.2 10*3/uL (ref 4.0–10.5)
nRBC: 0 % (ref 0.0–0.2)

## 2020-08-17 LAB — URINALYSIS, COMPLETE (UACMP) WITH MICROSCOPIC
Bacteria, UA: NONE SEEN
Bilirubin Urine: NEGATIVE
Glucose, UA: NEGATIVE mg/dL
Ketones, ur: NEGATIVE mg/dL
Nitrite: NEGATIVE
Protein, ur: NEGATIVE mg/dL
Specific Gravity, Urine: 1.01 (ref 1.005–1.030)
pH: 5 (ref 5.0–8.0)

## 2020-08-17 LAB — COMPREHENSIVE METABOLIC PANEL
ALT: 12 U/L (ref 0–44)
AST: 30 U/L (ref 15–41)
Albumin: 3.5 g/dL (ref 3.5–5.0)
Alkaline Phosphatase: 44 U/L (ref 38–126)
Anion gap: 15 (ref 5–15)
BUN: 11 mg/dL (ref 8–23)
CO2: 18 mmol/L — ABNORMAL LOW (ref 22–32)
Calcium: 7.9 mg/dL — ABNORMAL LOW (ref 8.9–10.3)
Chloride: 106 mmol/L (ref 98–111)
Creatinine, Ser: 0.87 mg/dL (ref 0.61–1.24)
GFR calc Af Amer: >60 mL/min (ref >60)
GFR calc non Af Amer: >60 mL/min (ref >60)
Glucose, Bld: 92 mg/dL (ref 70–99)
Potassium: 4.7 mmol/L (ref 3.5–5.1)
Sodium: 139 mmol/L (ref 135–145)
Total Bilirubin: 0.7 mg/dL (ref 0.3–1.2)
Total Protein: 7 g/dL (ref 6.5–8.1)

## 2020-08-17 LAB — TROPONIN I (HIGH SENSITIVITY): Troponin I (High Sensitivity): 6 ng/L (ref <18)

## 2020-08-17 LAB — LIPASE, BLOOD: Lipase: 25 U/L (ref 11–51)

## 2020-08-17 LAB — ETHANOL: Alcohol, Ethyl (B): 290 mg/dL — ABNORMAL HIGH (ref <10)

## 2020-08-17 MED ORDER — FAMOTIDINE 20 MG PO TABS
20.0000 mg | ORAL_TABLET | Freq: Once | ORAL | Status: AC
  Administered 2020-08-17: 20 mg via ORAL
  Filled 2020-08-17: qty 1

## 2020-08-17 MED ORDER — ONDANSETRON 4 MG PO TBDP
8.0000 mg | ORAL_TABLET | Freq: Once | ORAL | Status: AC
  Administered 2020-08-17: 8 mg via ORAL
  Filled 2020-08-17: qty 2

## 2020-08-17 NOTE — ED Notes (Addendum)
Bladder scan not needed per Cyril Loosen MD.

## 2020-08-17 NOTE — ED Provider Notes (Signed)
[facility] Emergency Department Provider Note ____________________________________________   First MD Initiated Contact with Patient 08/17/20 1317     (approximate)  I have reviewed the triage vital signs and the nursing notes.   HISTORY  Chief Complaint Fall  Level 5 caveat: History of present illness limited due to intoxication  HPI Arthur Wilcox is a 69 y.o. male with PMH as noted below who presents after he was found on the ground next to his barbecue by his family this afternoon.  He endorses alcohol use last night but is unable to give much other history.  He states he feels like he needs to urinate but cannot go.  He also feels nauseous and has abdominal pain.  Past Medical History:  Diagnosis Date  . Anxiety   . Chronic back pain   . Chronic back pain    pinced nerve  . Chronic neck pain    pinched nerve  . Depression    doesn't require meds  . Essential hypertension, benign    takes Lisinopril daily but forgets on occasion  . Headache(784.0)   . History of foot fracture   . Hyperlipidemia    hx of;has been off of Lipitor 6-57yrs   . Joint disease   . Migraine    last one a week ago  . Osteoarthritis   . Seizures (HCC)    has been off of Dilantin 80yr;last seizure 32yrs ago  . Shortness of breath    with exertion    Patient Active Problem List   Diagnosis Date Noted  . 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 Lumbar lateral recess stenosis (R:L3-4: L:L5-S1) 07/24/2019  . Chronic low back pain (Primary area of Pain) (Bilateral) (R>L) w/o sciatica 01/24/2019  . Abnormal MRI, lumbar spine (01/15/2019) 01/24/2019  . Osteoarthritis of hip (Right) 01/04/2019  . Chronic upper extremity pain (Fourth Area of Pain) (Bilateral) (R>L) 12/25/2018  . 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 History of osteomyelitis of the Lumbar Spine 12/25/2018    Class: History of  . History of CNS  infection (Epidural Abscess) 12/25/2018    Class: History of  . Lumbar spondylitis (HCC) 12/25/2018  . Trigger thumb (Bilateral) 12/25/2018  . Other intervertebral disc degeneration, lumbar region 12/25/2018  . Chronic hip pain (Bilateral (R>L) 12/25/2018  . Chronic groin pain (Right) 12/25/2018  . Cocaine use 12/18/2018  . Marijuana use 12/18/2018  . Elevated C-reactive protein (CRP) 12/08/2018  . Elevated sed rate 12/08/2018  . Vitamin D deficiency 12/08/2018  . Chronic low back pain (Primary Area of Pain) (Bilateral) (R>L) w/ sciatica (Right) 12/07/2018  . Chronic lower extremity pain (Secondary Area of Pain) (Right) 12/07/2018  . Chronic shoulder pain [facility] Area of Pain) (Bilateral) (R>L) 12/07/2018  . Hand pain (Bilateral) (R>L) 12/07/2018  . Chronic neck pain (Bilateral) (R>L) 12/07/2018  . Chronic pain syndrome 12/07/2018  . Pharmacologic therapy 12/07/2018  . Disorder of skeletal system 12/07/2018  . Problems influencing health status 12/07/2018  . Chronic sacroiliac joint pain (Bilateral) (R>L) 12/07/2018  . Wound infection after surgery 06/20/2012  . 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 Herniation of cervical intervertebral disc with radiculopathy 04/18/2012  . Preoperative evaluation to rule out surgical contraindication 02/23/2012  . Abnormal ECG 02/23/2012  . Essential hypertension, benign 02/23/2012  . Shortness of breath 02/23/2012  . Tobacco abuse 02/23/2012    Past Surgical History:  Procedure Laterality Date  .  LUMBAR LAMINECTOMY/DECOMPRESSION MICRODISCECTOMY  05/29/2012   Procedure: LUMBAR LAMINECTOMY/DECOMPRESSION MICRODISCECTOMY 2 LEVELS;  Surgeon: Temple Pacini, MD;  Location: [facility];  Service: Neurosurgery;  Laterality: Bilateral;  Bilateral Lumbar three through five decompressive lumbar laminectomy   . LUMBAR WOUND DEBRIDEMENT  06/19/2012   Procedure: LUMBAR WOUND DEBRIDEMENT;  Surgeon: Temple Pacini, MD;   Location: [facility];  Service: Neurosurgery;  Laterality: N/A;  Irrigation and Debridement of Lumbar Wound  . POSTERIOR CERVICAL LAMINECTOMY  04/18/2012   Procedure: POSTERIOR CERVICAL LAMINECTOMY;  Surgeon: Temple Pacini, MD;  Location: [facility];  Service: Neurosurgery;  Laterality: Right;  Right Cervical Seven-thoracic One Lamicectomy/Foraminotomy/Microdiscectomy    Prior to Admission medications   Medication Sig Start Date End Date Taking? Authorizing Provider  atorvastatin (LIPITOR) 40 MG tablet Take 40 mg by mouth daily.    [provider]  calcium carbonate (CALCIUM 600) 600 MG TABS tablet Take 1 tablet (600 mg total) by mouth 2 (two) times daily with a meal for 30 days. 12/25/18 07/24/19  Delano Metz, MD  cyclobenzaprine (FLEXERIL) 10 MG tablet Take 10 mg by mouth at bedtime as needed. For pain    [provider]  meloxicam (MOBIC) 15 MG tablet Take 15 mg by mouth daily.    [provider]  naproxen sodium (ALEVE) 220 MG tablet Take 220 mg by mouth daily as needed.    [provider]  sildenafil (VIAGRA) 100 MG tablet Take 100 mg by mouth daily as needed for erectile dysfunction.    [provider]    Allergies Patient has no known allergies.  Family History  Problem Relation Age of Onset  . Hypertension Mother   . Stroke Mother   . Diabetes type II Brother   . Coronary artery disease Brother   . Diabetes type II Sister   . Anesthesia problems Neg Hx   . Hypotension Neg Hx   . Malignant hyperthermia Neg Hx   . Pseudochol deficiency Neg Hx     Social History Social History   Tobacco Use  . Smoking status: Current Every Day Smoker    Packs/day: 0.50    Years: 44.00    Pack years: 22.00    Types: Cigarettes, Cigars  . Smokeless tobacco: Never Used  Substance Use Topics  . Alcohol use: Yes    Alcohol/week: 1.0 standard drink    Types: 1 Cans of beer per week    Comment: Regular intake-gin  . Drug use: Yes     Types: Marijuana    Comment: 04/09/12    Review of Systems Level 5 caveat: Unable to obtain review of systems due to alcohol intoxication    ____________________________________________   PHYSICAL EXAM:  VITAL SIGNS: ED Triage Vitals  Enc Vitals Group     BP 08/17/20 1239 (!) 151/62     Pulse Rate 08/17/20 1239 (!) 46     Resp 08/17/20 1239 16     Temp 08/17/20 1239 97.6 F (36.4 C)     Temp Source 08/17/20 1239 Oral     SpO2 08/17/20 1239 100 %     Weight 08/17/20 1232 160 lb (72.6 kg)     Height 08/17/20 1232 5\' 8"  (1.727 m)     Head Circumference --      Peak Flow --      Pain Score 08/17/20 1232 0     Pain Loc --      Pain Edu? --      Excl.  in GC? --     Constitutional: Alert, intoxicated and somewhat disheveled appearing. Eyes: Conjunctivae are normal.  EOMI.  PERRLA. Head: Atraumatic. Nose: No congestion/rhinnorhea. Mouth/Throat: Mucous membranes are moist.   Neck: Normal range of motion.  Cardiovascular: Normal rate, regular rhythm. Grossly normal heart sounds.  Good peripheral circulation. Respiratory: Normal respiratory effort.  No retractions. Lungs CTAB. Gastrointestinal: Soft with mild diffuse discomfort to palpation.  No focal tenderness or peritoneal signs.  No distention.  Genitourinary: No flank tenderness. Musculoskeletal: No lower extremity edema.  Extremities warm and well perfused.  Neurologic: Slurred speech.  Motor intact in all extremities. Skin:  Skin is warm and dry. No rash noted. Psychiatric: Unable to assess due to intoxication.  ____________________________________________   LABS (all labs ordered are listed, but only abnormal results are displayed)  Labs Reviewed  COMPREHENSIVE METABOLIC PANEL - Abnormal; Notable for the following components:      Result Value   CO2 18 (*)    Calcium 7.9 (*)    All other components within normal limits  URINE DRUG SCREEN, QUALITATIVE ([facility] ONLY) - Abnormal; Notable for the following components:    Cocaine Metabolite,Ur [place] POSITIVE (*)    Cannabinoid 50 Ng, Ur [place] POSITIVE (*)    All other components within normal limits  ETHANOL - Abnormal; Notable for the following components:   Alcohol, Ethyl (B) 290 (*)    All other components within normal limits  URINALYSIS, COMPLETE (UACMP) WITH MICROSCOPIC - Abnormal; Notable for the following components:   Color, Urine YELLOW (*)    APPearance HAZY (*)    Hgb urine dipstick LARGE (*)    Leukocytes,Ua TRACE (*)    All other components within normal limits  CBC WITH DIFFERENTIAL/PLATELET  LIPASE, BLOOD  TROPONIN I (HIGH SENSITIVITY)   ____________________________________________  EKG  ED ECG REPORT I, Dionne BucySebastian Stefanos Haynesworth, the attending physician, personally viewed and interpreted this ECG.  Date: 08/17/2020 EKG Time: 1240 Rate: 71 Rhythm: normal sinus rhythm QRS Axis: normal Intervals: RBBB ST/T Wave abnormalities: Nonspecific ST abnormality Narrative Interpretation: Nonspecific ST abnormalities when compared to EKG of 02/23/2012  ____________________________________________  RADIOLOGY  CT head: No ICH or other acute abnormality CT cervical spine: No acute fracture  ____________________________________________   PROCEDURES  Procedure(s) performed: No  Procedures  Critical Care performed: No ____________________________________________   INITIAL IMPRESSION / ASSESSMENT AND PLAN / ED COURSE  Pertinent labs & imaging results that were available during my care of the patient were reviewed by me and considered in my medical decision making (see chart for details).  69 year old male with PMH as noted above presents after he was found down outside this afternoon.  He endorses alcohol use last night.  He is unable to give much coherent history due to intoxication, but states that his belly hurts and he feels like he cannot urinate.  I reviewed the past medical records in Epic; the patient has not been seen in the ED  since 2016.  On exam, he is intoxicated appearing and somewhat somnolent.  Vital signs are normal except for hypertension.  He is able to answer some questions and follow commands, however unable to give much coherent history.  Neurologic exam is nonfocal.  There is no visible trauma.  He has discomfort to palpation of his abdomen with no focal tenderness or peritoneal signs.  Initial lab work-up reveals an alcohol level of 290 along withCocaine and cannabinoids on the UDS.  Overall presentation is consistent with drug intoxication.  Because he was  found down and possibly fell, and given the limited neurologic exam due to intoxication, will obtain a CT head and C-spine.  The abdominal pain is most consistent with gastritis versus possible pancreatitis.  I have added on a lipase and will treat with Zofran and Pepcid.  EKG shows some nonspecific T wave abnormalities which are new, but the most recent comparison is in 2013.  I have added on a troponin as well.  If the work-up is negative, we will observe for sobriety and I anticipate discharge home.  ----------------------------------------- 3:23 PM on 08/17/2020 -----------------------------------------  I have signed the patient out to the oncoming physician Dr. Cyril Loosen.  CT head and C-spine are negative.  The troponin is also negative.  The patient is pending a lipase and then will be observed for sobriety.  ____________________________________________   FINAL CLINICAL IMPRESSION(S) / ED DIAGNOSES  Final diagnoses:  Alcoholic intoxication without complication (HCC)      NEW MEDICATIONS STARTED DURING THIS VISIT:  New Prescriptions   No medications on file     Note:  This document was prepared using Dragon voice recognition software and may include unintentional dictation errors.    Dionne Bucy, MD 08/17/20 [phone]

## 2020-08-17 NOTE — ED Notes (Signed)
Pt's daughter's fiance, Evaristo Bury, will be picking pt up.  He will call when he arrives and we will meet him up front.

## 2020-08-17 NOTE — ED Notes (Addendum)
Daughter states this pt has a hx of seizures, has not been taking medication (Dilantin) regularly.  Kinner MD notified.

## 2020-08-17 NOTE — ED Notes (Signed)
Pt provided clean disposable pants and brief placed, peri care provided.

## 2020-08-17 NOTE — ED Notes (Signed)
CT has been called  

## 2020-08-17 NOTE — ED Notes (Signed)
Daughter: [name]  Cell Phone Number: [phone]

## 2020-08-17 NOTE — ED Notes (Signed)
Pt was ambulatory to the bathroom without assist.  Daughter Kee Drudge [phone], picked pt up.

## 2020-08-17 NOTE — ED Notes (Signed)
Pt admits to this RN that he used crack last night

## 2020-08-17 NOTE — ED Notes (Signed)
Pt found to be incontinent of urine.  Pt is sleeping, easily roused, able to take PO meds.

## 2020-08-17 NOTE — ED Triage Notes (Signed)
Pt to ED via [facility] from home for fall. Per EMS pt was found by family next to [facility] [place]. Pt admits to heavy ETOH last PM. Per EMS pt has pinpoint pupils. Pt has hx/o substance abuse. Pt arrives to ED c/o urge to urinate but unable to go much at this time. Pt has abrasion on his forehead. Pt does not remember falling and is oriented to self only at this time.   Pt has 16 G IV and has been given about 900 cc on NS. CBG 121.

## 2020-08-18 ENCOUNTER — Emergency Department
Admission: EM | Admit: 2020-08-18 | Discharge: 2020-08-18 | Disposition: A | Discharge status: Home / Self Care | Payer: Medicare Other | Attending: Emergency Medicine | Admitting: Emergency Medicine

## 2020-08-18 ENCOUNTER — Encounter: Payer: Self-pay | Admitting: Emergency Medicine

## 2020-08-18 DIAGNOSIS — I10 Essential (primary) hypertension: Secondary | ICD-10-CM | POA: Diagnosis not present

## 2020-08-18 DIAGNOSIS — K649 Unspecified hemorrhoids: Secondary | ICD-10-CM

## 2020-08-18 DIAGNOSIS — R339 Retention of urine, unspecified: Secondary | ICD-10-CM | POA: Insufficient documentation

## 2020-08-18 DIAGNOSIS — Z79899 Other long term (current) drug therapy: Secondary | ICD-10-CM | POA: Diagnosis not present

## 2020-08-18 LAB — URINALYSIS, COMPLETE (UACMP) WITH MICROSCOPIC
Bacteria, UA: NONE SEEN
Bilirubin Urine: NEGATIVE
Glucose, UA: NEGATIVE mg/dL
Ketones, ur: 5 mg/dL — AB
Leukocytes,Ua: NEGATIVE
Nitrite: NEGATIVE
Protein, ur: NEGATIVE mg/dL
Specific Gravity, Urine: 1.015 (ref 1.005–1.030)
Squamous Epithelial / HPF: NONE SEEN (ref 0–5)
pH: 5 (ref 5.0–8.0)

## 2020-08-18 MED ORDER — LISINOPRIL 10 MG PO TABS
10.0000 mg | ORAL_TABLET | Freq: Once | ORAL | Status: AC
  Administered 2020-08-18: 10 mg via ORAL
  Filled 2020-08-18: qty 1

## 2020-08-18 NOTE — ED Notes (Signed)
Pt given leg bag and instructed on how to use it. Pt encouraged to go to his PCP or return to ED with any questions or concerns

## 2020-08-18 NOTE — ED Provider Notes (Signed)
[facility] Emergency Department Provider Note  ____________________________________________   First MD Initiated Contact with Patient 08/18/20 1612     (approximate)  I have reviewed the triage vital signs and the nursing notes.   HISTORY  Chief Complaint Urinary Retention   HPI Arthur Wilcox is a 69 y.o. male in the past medical history of anxiety, chronic back pain, HTN, HDL, seizure disorder, and visit in the emergency room yesterday during which she was observed for intoxication after he was found by family with ED work-up remarkable yesterday for an EtOH of 290 with cocaine and cannabinoids in his UDS who presents for assessment of difficulty urinating.  Patient states he was up frequently last night was 66-year-old.  Couple drops of urine out.  He states he had a little bit of bright red blood when he had a bowel movement this morning but otherwise has been in his usual state of health without any recent fevers, chills, cough, shortness of breath, chest pain, back pain, extremity pain, rash, recent injuries.  States he initially had some abdominal pain but after a Foley was placed in triage his abdominal pain is completely resolved.  He has had prior similar episodes in the past although never this severe.  He did endorse drinking symptomatic alcohol use of cocaine and marijuana yesterday but none today.  He did state he forgot to take his blood pressure medicines today.  He denies any other acute concerns at this time.         Past Medical History:  Diagnosis Date  . Anxiety   . Chronic back pain   . Chronic back pain    pinced nerve  . Chronic neck pain    pinched nerve  . Depression    doesn't require meds  . Essential hypertension, benign    takes Lisinopril daily but forgets on occasion  . Headache(784.0)   . History of foot fracture   . Hyperlipidemia    hx of;has been off of Lipitor 6-62yrs   . Joint disease   . Migraine    last one a week  ago  . Osteoarthritis   . Seizures (HCC)    has been off of Dilantin 47yr;last seizure 34yrs ago  . Shortness of breath    with exertion    Patient Active Problem List   Diagnosis Date Noted  . 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 Lumbar lateral recess stenosis (R:L3-4: L:L5-S1) 07/24/2019  . Chronic low back pain (Primary area of Pain) (Bilateral) (R>L) w/o sciatica 01/24/2019  . Abnormal MRI, lumbar spine (01/15/2019) 01/24/2019  . Osteoarthritis of hip (Right) 01/04/2019  . Chronic upper extremity pain (Fourth Area of Pain) (Bilateral) (R>L) 12/25/2018  . 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 History of osteomyelitis of the Lumbar Spine 12/25/2018    Class: History of  . History of CNS infection (Epidural Abscess) 12/25/2018    Class: History of  . Lumbar spondylitis (HCC) 12/25/2018  . Trigger thumb (Bilateral) 12/25/2018  . Other intervertebral disc degeneration, lumbar region 12/25/2018  . Chronic hip pain (Bilateral (R>L) 12/25/2018  . Chronic groin pain (Right) 12/25/2018  . Cocaine use 12/18/2018  . Marijuana use 12/18/2018  . Elevated C-reactive protein (CRP) 12/08/2018  . Elevated sed rate 12/08/2018  . Vitamin D deficiency 12/08/2018  . Chronic low back pain (Primary Area of Pain) (Bilateral) (R>L) w/ sciatica (Right) 12/07/2018  . Chronic lower extremity pain (Secondary Area of  Pain) (Right) 12/07/2018  . Chronic shoulder pain [facility] Area of Pain) (Bilateral) (R>L) 12/07/2018  . Hand pain (Bilateral) (R>L) 12/07/2018  . Chronic neck pain (Bilateral) (R>L) 12/07/2018  . Chronic pain syndrome 12/07/2018  . Pharmacologic therapy 12/07/2018  . Disorder of skeletal system 12/07/2018  . Problems influencing health status 12/07/2018  . Chronic sacroiliac joint pain (Bilateral) (R>L) 12/07/2018  . Wound infection after surgery 06/20/2012  . 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 Herniation of cervical intervertebral disc with radiculopathy 04/18/2012  . Preoperative evaluation to rule out surgical contraindication 02/23/2012  . Abnormal ECG 02/23/2012  . Essential hypertension, benign 02/23/2012  . Shortness of breath 02/23/2012  . Tobacco abuse 02/23/2012    Past Surgical History:  Procedure Laterality Date  . LUMBAR LAMINECTOMY/DECOMPRESSION MICRODISCECTOMY  05/29/2012   Procedure: LUMBAR LAMINECTOMY/DECOMPRESSION MICRODISCECTOMY 2 LEVELS;  Surgeon: Temple Pacini, MD;  Location: [facility];  Service: Neurosurgery;  Laterality: Bilateral;  Bilateral Lumbar three through five decompressive lumbar laminectomy   . LUMBAR WOUND DEBRIDEMENT  06/19/2012   Procedure: LUMBAR WOUND DEBRIDEMENT;  Surgeon: Temple Pacini, MD;  Location: [facility];  Service: Neurosurgery;  Laterality: N/A;  Irrigation and Debridement of Lumbar Wound  . POSTERIOR CERVICAL LAMINECTOMY  04/18/2012   Procedure: POSTERIOR CERVICAL LAMINECTOMY;  Surgeon: Temple Pacini, MD;  Location: [facility];  Service: Neurosurgery;  Laterality: Right;  Right Cervical Seven-thoracic One Lamicectomy/Foraminotomy/Microdiscectomy    Prior to Admission medications   Medication Sig Start Date End Date Taking? Authorizing Provider  atorvastatin (LIPITOR) 40 MG tablet Take 40 mg by mouth daily.    [provider]  calcium carbonate (CALCIUM 600) 600 MG TABS tablet Take 1 tablet (600 mg total) by mouth 2 (two) times daily with a meal for 30 days. 12/25/18 07/24/19  Delano Metz, MD  cyclobenzaprine (FLEXERIL) 10 MG tablet Take 10 mg by mouth at bedtime as needed. For pain    [provider]  meloxicam (MOBIC) 15 MG tablet Take 15 mg by mouth daily.    [provider]  naproxen sodium (ALEVE) 220 MG tablet Take 220 mg by mouth daily as needed.    [provider]  sildenafil (VIAGRA) 100 MG tablet Take 100 mg by mouth daily as needed for erectile dysfunction.     [provider]    Allergies Patient has no known allergies.  Family History  Problem Relation Age of Onset  . Hypertension Mother   . Stroke Mother   . Diabetes type II Brother   . Coronary artery disease Brother   . Diabetes type II Sister   . Anesthesia problems Neg Hx   . Hypotension Neg Hx   . Malignant hyperthermia Neg Hx   . Pseudochol deficiency Neg Hx     Social History Social History   Tobacco Use  . Smoking status: Current Every Day Smoker    Packs/day: 0.50    Years: 44.00    Pack years: 22.00    Types: Cigarettes, Cigars  . Smokeless tobacco: Never Used  Substance Use Topics  . Alcohol use: Yes    Alcohol/week: 1.0 standard drink    Types: 1 Cans of beer per week    Comment: Regular intake-gin  . Drug use: Yes    Types: Marijuana    Comment: 04/09/12    Review of Systems  Review of Systems  Constitutional: Negative for chills and fever.  HENT: Negative for sore throat.  Eyes: Negative for pain.  Respiratory: Negative for cough and stridor.   Cardiovascular: Negative for chest pain.  Gastrointestinal: Positive for abdominal pain. Negative for vomiting.  Genitourinary: Positive for urgency. Negative for dysuria, flank pain, frequency and hematuria.  Musculoskeletal: Negative for myalgias.  Skin: Negative for rash.  Neurological: Negative for seizures, loss of consciousness and headaches.  Psychiatric/Behavioral: Negative for suicidal ideas.  All other systems reviewed and are negative.     ____________________________________________   PHYSICAL EXAM:  VITAL SIGNS: ED Triage Vitals  Enc Vitals Group     BP 08/18/20 1359 (!) 182/59     Pulse Rate 08/18/20 1359 (!) 57     Resp 08/18/20 1359 18     Temp 08/18/20 1359 98.7 F (37.1 C)     Temp Source 08/18/20 1359 Oral     SpO2 08/18/20 1359 99 %     Weight 08/18/20 1348 150 lb (68 kg)     Height 08/18/20 1348 5\' 8"  (1.727 m)     Head Circumference --      Peak Flow --       Pain Score 08/18/20 1348 7     Pain Loc --      Pain Edu? --      Excl. in GC? --    Vitals:   08/18/20 1359  BP: (!) 182/59  Pulse: (!) 57  Resp: 18  Temp: 98.7 F (37.1 C)  SpO2: 99%   Physical Exam Vitals and nursing note reviewed. Exam conducted with a chaperone present.  Constitutional:      Appearance: He is well-developed.  HENT:     Head: Normocephalic and atraumatic.     Right Ear: External ear normal.     Left Ear: External ear normal.     Nose: Nose normal.  Eyes:     Conjunctiva/sclera: Conjunctivae normal.  Cardiovascular:     Rate and Rhythm: Normal rate and regular rhythm.     Heart sounds: No murmur heard.   Pulmonary:     Effort: Pulmonary effort is normal. No respiratory distress.     Breath sounds: Normal breath sounds.  Abdominal:     Palpations: Abdomen is soft.     Tenderness: There is no abdominal tenderness.  Genitourinary:    Rectum: External hemorrhoid present.  Musculoskeletal:     Cervical back: Neck supple.  Skin:    General: Skin is warm and dry.     Capillary Refill: Capillary refill takes less than 2 seconds.  Neurological:     Mental Status: He is alert and oriented to person, place, and time.  Psychiatric:        Mood and Affect: Mood normal.      ____________________________________________   LABS (all labs ordered are listed, but only abnormal results are displayed)  Labs Reviewed  URINALYSIS, COMPLETE (UACMP) WITH MICROSCOPIC - Abnormal; Notable for the following components:      Result Value   Color, Urine YELLOW (*)    APPearance CLEAR (*)    Hgb urine dipstick SMALL (*)    Ketones, ur 5 (*)    All other components within normal limits   ____________________________________________  EKG  Sinus rhythm with a ventricular of 71, right bundle branch block, somewhat poor tracing no clear evidence of acute ischemia when compared to prior  EKGs. ____________________________________________ ____________________________________________   PROCEDURES  Procedure(s) performed (including Critical Care):  Procedures   ____________________________________________   INITIAL IMPRESSION / ASSESSMENT AND PLAN / ED COURSE  Patient presents with us to history exam for assessment of abdominal pain associated with difficulty urinating that began last night and is similar to prior episodes but worse than usual.  Patient is hypertensive otherwise stable vital signs on arrival.  Exam as above.  Patient did with Foley placed in triage prior to my assessment and had return approximately 800 cc of clear yellow urine.  UA obtained does show some microscopic blood and ketones but otherwise unremarkable.  Overall patient's history, exam, and work-up is most consistent with acute urinary tension most likely secondary to BPH as it seems this has been a recurrent problem for the patient in the past.  Given patient states he had complete resolution of all his pain and currently denies any acute complaints and wishes to be discharged we will plan to discharge patient with Foley catheter in place and close outpatient PCP follow-up.  Presentation is not consistent with torsion, epididymitis, appendicitis, diverticulitis, AAA, or other immediate life-threatening intra-abdominal pathology.  Some report of bright red blood with bowel movement this morning likely secondary to bleeding external hemorrhoid.  Patient was given 1 dose of his home blood pressure medications and encouraged to take them as prescribed.  Patient discharged stable condition per strict return precautions with discussed.   ____________________________________________   FINAL CLINICAL IMPRESSION(S) / ED DIAGNOSES  Final diagnoses:  Urinary retention  Hypertension, unspecified type  Hemorrhoids, unspecified hemorrhoid type    Medications  lisinopril (ZESTRIL) tablet 10 mg (has  no administration in time range)     ED Discharge Orders    None       Note:  This document was prepared using Dragon voice recognition software and may include unintentional dictation errors.   Gilles ChiquitoSmith, Benjaman Artman P, MD 08/18/20 [phone]

## 2020-08-18 NOTE — ED Notes (Signed)
Bladder scan preformed. of urine found in bladder.

## 2020-08-18 NOTE — ED Notes (Signed)
Pt requesting something to drink and crackers. Per Judeth Cornfield, RN pt can have something to drink and eat. Pt was given a cup of water and some saltine crackers.

## 2020-08-18 NOTE — ED Triage Notes (Signed)
Patient presents to the ED with urinary retention since Saturday night/Sunday morning.  Patient states he can urinate in very small amounts but cannot empty his bladder.  Patient denies history of the same.  Patient also reports a bm this morning with a small amount of bright red blood.  Patient states, "I drank about four bottles of water last night but I can't pee it out."  Patient is doubled over in triage and appears very uncomfortable.  Bladder Scan shows >863ml in bladder.

## 2020-08-27 ENCOUNTER — Ambulatory Visit (INDEPENDENT_AMBULATORY_CARE_PROVIDER_SITE_OTHER): Payer: Medicare Other | Admitting: Physician Assistant

## 2020-08-27 ENCOUNTER — Encounter: Payer: Self-pay | Admitting: Urology

## 2020-08-27 ENCOUNTER — Other Ambulatory Visit: Payer: Self-pay

## 2020-08-27 ENCOUNTER — Ambulatory Visit (INDEPENDENT_AMBULATORY_CARE_PROVIDER_SITE_OTHER): Payer: Medicare Other | Admitting: Urology

## 2020-08-27 VITALS — BP 152/82 | HR 80 | Ht 68.0 in | Wt 140.0 lb

## 2020-08-27 DIAGNOSIS — Z466 Encounter for fitting and adjustment of urinary device: Secondary | ICD-10-CM

## 2020-08-27 DIAGNOSIS — R339 Retention of urine, unspecified: Secondary | ICD-10-CM

## 2020-08-27 DIAGNOSIS — N401 Enlarged prostate with lower urinary tract symptoms: Secondary | ICD-10-CM | POA: Diagnosis not present

## 2020-08-27 DIAGNOSIS — K5900 Constipation, unspecified: Secondary | ICD-10-CM

## 2020-08-27 DIAGNOSIS — N138 Other obstructive and reflux uropathy: Secondary | ICD-10-CM

## 2020-08-27 LAB — BLADDER SCAN AMB NON-IMAGING

## 2020-08-27 MED ORDER — TAMSULOSIN HCL 0.4 MG PO CAPS: 0.4000 mg | ORAL_CAPSULE | Freq: Every day | ORAL | 11 refills | Status: AC

## 2020-08-27 MED ORDER — SULFAMETHOXAZOLE-TRIMETHOPRIM 800-160 MG PO TABS
1.0000 | ORAL_TABLET | Freq: Once | ORAL | Status: AC
  Administered 2020-08-27: 1 via ORAL

## 2020-08-27 NOTE — Progress Notes (Signed)
08/27/20 12:42 PM   Arthur Wilcox 1950-12-27 [id number]  CC: Urinary retention  HPI: I saw Arthur Wilcox in urology clinic today for urinary retention.  He is a very comorbid 69 year old male with history of alcohol and drug abuse who presented to the ED on 08/17/2020 after being found down after a night of drinking and drug abuse.  He was unable to void at that time, and a catheter was placed with return of a large volume of yellow urine.  There was no evidence of infection on urinalysis.  He has a distant history of back surgery in 2013.  He reports worsening urinary symptoms over the last 2 years with weak stream and feeling of incomplete emptying, but has never tried any medications for this previously.  On CT from 2019 prostate measures 47 g.  He denies any prior episodes of UTI, gross hematuria, or urinary retention.  No prior PSA values to review.   PMH: Past Medical History:  Diagnosis Date  . Anxiety   . Chronic back pain   . Chronic back pain    pinced nerve  . Chronic neck pain    pinched nerve  . Depression    doesn't require meds  . Essential hypertension, benign    takes Lisinopril daily but forgets on occasion  . Headache(784.0)   . History of foot fracture   . Hyperlipidemia    hx of;has been off of Lipitor 6-65yrs   . Joint disease   . Migraine    last one a week ago  . Osteoarthritis   . Seizures (HCC)    has been off of Dilantin 28yr;last seizure 9yrs ago  . Shortness of breath    with exertion    Surgical History: Past Surgical History:  Procedure Laterality Date  . LUMBAR LAMINECTOMY/DECOMPRESSION MICRODISCECTOMY  05/29/2012   Procedure: LUMBAR LAMINECTOMY/DECOMPRESSION MICRODISCECTOMY 2 LEVELS;  Surgeon: Temple Pacini, MD;  Location: [facility];  Service: Neurosurgery;  Laterality: Bilateral;  Bilateral Lumbar three through five decompressive lumbar laminectomy   . LUMBAR WOUND DEBRIDEMENT  06/19/2012   Procedure: LUMBAR WOUND DEBRIDEMENT;  Surgeon: Temple Pacini, MD;  Location: [facility];  Service: Neurosurgery;  Laterality: N/A;  Irrigation and Debridement of Lumbar Wound  . POSTERIOR CERVICAL LAMINECTOMY  04/18/2012   Procedure: POSTERIOR CERVICAL LAMINECTOMY;  Surgeon: Temple Pacini, MD;  Location: [facility];  Service: Neurosurgery;  Laterality: Right;  Right Cervical Seven-thoracic One Lamicectomy/Foraminotomy/Microdiscectomy    Family History: Family History  Problem Relation Age of Onset  . Hypertension Mother   . Stroke Mother   . Diabetes type II Brother   . Coronary artery disease Brother   . Diabetes type II Sister   . Anesthesia problems Neg Hx   . Hypotension Neg Hx   . Malignant hyperthermia Neg Hx   . Pseudochol deficiency Neg Hx     Social History:  reports that he has been smoking cigarettes and cigars. He has a 22.00 pack-year smoking history. He has never used smokeless tobacco. He reports current alcohol use of about 1.0 standard drink of alcohol per week. He reports current drug use. Drug: Marijuana.  Physical Exam: BP (!) 152/82 (BP Location: Left Arm, Patient Position: Sitting, Cuff Size: Normal)   Pulse 80   Ht 5\' 8"  (1.727 m)   Wt 140 lb (63.5 kg)   BMI 21.29 kg/m    Constitutional:  Alert and oriented, No acute distress. Cardiovascular: No clubbing, cyanosis, or edema. Respiratory:  Normal respiratory effort, no increased work of breathing. GI: Abdomen is soft, nontender, nondistended, no abdominal masses GU: Uncircumcised phallus, catheter with clear yellow urine  Laboratory Data: Reviewed, see HPI  Pertinent Imaging: I have personally reviewed the CT scan from 2019 showing no hydronephrosis, and a 47 g prostate  Assessment & Plan:   In summary, is a 69 year old male with a history of alcohol and drug abuse and 2 years of worsening urinary symptoms who recently developed urinary retention after being found down after a night of drinking and drug abuse.  Catheter was placed in the ED.  Bactrim was  given prior to catheter removal for prophylaxis.  A fill and pull voiding trial was performed in clinic today, and he was able to empty his bladder well with a PVR of only 70 mL.  I requested he return this afternoon to confirm bladder emptying, but transportation is an issue for him, and he may not be able to come back.  We discussed return precautions at length.  Reviewed that his urinary retention was likely primarily secondary to alcohol and drug use, however BPH likely plays a role as well.  I recommended starting Flomax 0.4 mg nightly to help with his urination, and close follow-up in 1 month for IPSS and PVR.  Return precautions were discussed at length.  Flomax nightly RTC 1 month for IPSS and PVR  Legrand Rams, MD 08/27/2020  [facility] [address] [place], [place] [address] [phone]

## 2020-08-27 NOTE — Patient Instructions (Signed)
Acute Urinary Retention, Male  Acute urinary retention means that you cannot pee (urinate) at all, or that you pee too little and your bladder is not emptied completely. If it is not treated, it can lead to kidney damage or other serious problems. Follow these instructions at home:  Take over-the-counter and prescription medicines only as told by your doctor. Ask your doctor what medicines you should stay away from. Do not take any medicine unless your doctor says it is okay to do so.  If you were sent home with a tube that drains the bladder (catheter), take care of it as told by your doctor.  Drink enough fluid to keep your pee clear or pale yellow.  If you were given an antibiotic, take it as told by your doctor. Do not stop taking the antibiotic even if you start to feel better.  Do not use any products that contain nicotine or tobacco, such as cigarettes and e-cigarettes. If you need help quitting, ask your doctor.  Watch for changes in your symptoms. Tell your doctor about them.  If told, track changes in your blood pressure at home. Tell your doctor about them.  Keep all follow-up visits as told by your doctor. This is important. Contact a doctor if:  You have spasms or you leak pee when you have spasms. Get help right away if:  You have chills or a fever.  You have a tube that drains the bladder and: ? The tube stops draining pee. ? The tube falls out.  You have blood in your pee. Summary  Acute urinary retention means that you have problems peeing. It may mean that you cannot pee at all, or that you pee too little.  If this condition is not treated, it can lead to kidney damage or other serious problems.  If you were sent home with a tube that drains the bladder, take care of it as told by your doctor.  Monitor any changes in your symptoms. Tell your doctor about any changes. This information is not intended to replace advice given to you by your health care  provider. Make sure you discuss any questions you have with your health care provider. Document Revised: 02/08/2019 Document Reviewed: 12/24/2016 Elsevier Patient Education  2020 [employer] Benign Prostatic Hyperplasia  Benign prostatic hyperplasia (BPH) is an enlarged prostate gland that is caused by the normal aging process and not by cancer. The prostate is a walnut-sized gland that is involved in the production of semen. It is located in front of the rectum and below the bladder. The bladder stores urine and the urethra is the tube that carries the urine out of the body. The prostate may get bigger as a man gets older. An enlarged prostate can press on the urethra. This can make it harder to pass urine. The build-up of urine in the bladder can cause infection. Back pressure and infection may progress to bladder damage and kidney (renal) failure. What are the causes? This condition is part of a normal aging process. However, not all men develop problems from this condition. If the prostate enlarges away from the urethra, urine flow will not be blocked. If it enlarges toward the urethra and compresses it, there will be problems passing urine. What increases the risk? This condition is more likely to develop in men over the age of 50 years. What are the signs or symptoms? Symptoms of this condition include:  Getting up often during the night to urinate.  Needing  to urinate frequently during the day.  Difficulty starting urine flow.  Decrease in size and strength of your urine stream.  Leaking (dribbling) after urinating.  Inability to pass urine. This needs immediate treatment.  Inability to completely empty your bladder.  Pain when you pass urine. This is more common if there is also an infection.  Urinary tract infection (UTI). How is this diagnosed? This condition is diagnosed based on your medical history, a physical exam, and your symptoms. Tests will also be done, such  as:  A post-void bladder scan. This measures any amount of urine that may remain in your bladder after you finish urinating.  A digital rectal exam. In a rectal exam, your health care provider checks your prostate by putting a lubricated, gloved finger into your rectum to feel the back of your prostate gland. This exam detects the size of your gland and any abnormal lumps or growths.  An exam of your urine (urinalysis).  A prostate specific antigen (PSA) screening. This is a blood test used to screen for prostate cancer.  An ultrasound. This test uses sound waves to electronically produce a picture of your prostate gland. Your health care provider may refer you to a specialist in kidney and prostate diseases (urologist). How is this treated? Once symptoms begin, your health care provider will monitor your condition (active surveillance or watchful waiting). Treatment for this condition will depend on the severity of your condition. Treatment may include:  Observation and yearly exams. This may be the only treatment needed if your condition and symptoms are mild.  Medicines to relieve your symptoms, including: ? Medicines to shrink the prostate. ? Medicines to relax the muscle of the prostate.  Surgery in severe cases. Surgery may include: ? Prostatectomy. In this procedure, the prostate tissue is removed completely through an open incision or with a laparoscope or robotics. ? Transurethral resection of the prostate (TURP). In this procedure, a tool is inserted through the opening at the tip of the penis (urethra). It is used to cut away tissue of the inner core of the prostate. The pieces are removed through the same opening of the penis. This removes the blockage. ? Transurethral incision (TUIP). In this procedure, small cuts are made in the prostate. This lessens the prostate's pressure on the urethra. ? Transurethral microwave thermotherapy (TUMT). This procedure uses microwaves to create  heat. The heat destroys and removes a small amount of prostate tissue. ? Transurethral needle ablation (TUNA). This procedure uses radio frequencies to destroy and remove a small amount of prostate tissue. ? Interstitial laser coagulation (ILC). This procedure uses a laser to destroy and remove a small amount of prostate tissue. ? Transurethral electrovaporization (TUVP). This procedure uses electrodes to destroy and remove a small amount of prostate tissue. ? Prostatic urethral lift. This procedure inserts an implant to push the lobes of the prostate away from the urethra. Follow these instructions at home:  Take over-the-counter and prescription medicines only as told by your health care provider.  Monitor your symptoms for any changes. Contact your health care provider with any changes.  Avoid drinking large amounts of liquid before going to bed or out in public.  Avoid or reduce how much caffeine or alcohol you drink.  Give yourself time when you urinate.  Keep all follow-up visits as told by your health care provider. This is important. Contact a health care provider if:  You have unexplained back pain.  Your symptoms do not get better with  treatment.  You develop side effects from the medicine you are taking.  Your urine becomes very dark or has a bad smell.  Your lower abdomen becomes distended and you have trouble passing your urine. Get help right away if:  You have a fever or chills.  You suddenly cannot urinate.  You feel lightheaded, or very dizzy, or you faint.  There are large amounts of blood or clots in the urine.  Your urinary problems become hard to manage.  You develop moderate to severe low back or flank pain. The flank is the side of your body between the ribs and the hip. These symptoms may represent a serious problem that is an emergency. Do not wait to see if the symptoms will go away. Get medical help right away. Call your local emergency services  (911 in the [place] Do not drive yourself to the hospital. Summary  Benign prostatic hyperplasia (BPH) is an enlarged prostate that is caused by the normal aging process and not by cancer.  An enlarged prostate can press on the urethra. This can make it hard to pass urine.  This condition is part of a normal aging process and is more likely to develop in men over the age of 50 years.  Get help right away if you suddenly cannot urinate. This information is not intended to replace advice given to you by your health care provider. Make sure you discuss any questions you have with your health care provider. Document Revised: 10/17/2018 Document Reviewed: 12/27/2016 Elsevier Patient Education  2020 [employer].

## 2020-08-27 NOTE — Progress Notes (Signed)
Patient returned to clinic this afternoon for repeat PVR following a.m. fill and pull voiding trial.  He reported significant abdominal pain with PVR 564 mL.  Foley catheter placed in clinic, see separate procedure note for details.  Counseled patient that I would like him to start Flomax per Dr. Keane Scrape recommendations this morning with plans for repeat voiding trial in 1 week.  He expressed understanding.  Additionally, patient has a history of spinal surgery for management of lumbosacral foraminal stenosis and spondylitis.  He reports difficulty with bowel movements today associated with difficulty urinating.  He denies constipation at baseline, however he does report a history of hemorrhoids and occasional straining with defecation.  He reports "uneven" rectal vault that sometimes requires manual pressure to empty.  Based on concerns for possible structural GI changes, I am placing a referral for gastroenterology today.  Unclear if there is a neurogenic component to his bowel-bladder dysfunction today as well.  Patient may benefit from urodynamics in the future if he is unable to pass a voiding trial.  Return in about 1 week (around 09/03/2020) for Voiding trial.

## 2020-08-27 NOTE — Progress Notes (Signed)
Simple Catheter Placement  Due to urinary retention patient is present today for a foley cath placement.  Patient was cleaned and prepped in a sterile fashion with betadine. A 16 coude FR foley catheter was inserted, urine return was noted  , urine was yellow in color.  The balloon was filled with 10cc of sterile water.  A leg bag was attached for drainage. Patient was also given a night bag to take home and was given instruction on how to change from one bag to another.  Patient was given instruction on proper catheter care.  Patient tolerated well, complications were noted as: patient had very painful bladder spasms   Performed by: Eligha Bridegroom, CMA

## 2020-08-27 NOTE — Progress Notes (Signed)
Fill and Pull Catheter Removal  Patient is present today for a catheter removal.  Patient was cleaned and prepped in a sterile fashion of sterile water/ saline was instilled into the bladder when the patient felt the urge to urinate. 58ml of water was then drained from the balloon. A 16FR foley cath was removed from the bladder no complications were noted. Patient as then given some time to void on their own. Patient was able to void  on their own after some time. PVR 68mL. Patient tolerated well.  Performed by: Debbe Bales, CMA   Follow up/ Additional notes: RTC this afternoon for PVR

## 2020-09-04 ENCOUNTER — Ambulatory Visit (INDEPENDENT_AMBULATORY_CARE_PROVIDER_SITE_OTHER): Payer: Medicare Other | Admitting: Physician Assistant

## 2020-09-04 ENCOUNTER — Ambulatory Visit: Payer: Self-pay | Admitting: Physician Assistant

## 2020-09-04 ENCOUNTER — Encounter: Payer: Self-pay | Admitting: Physician Assistant

## 2020-09-04 ENCOUNTER — Other Ambulatory Visit: Payer: Self-pay

## 2020-09-04 VITALS — BP 176/75 | HR 79 | Ht 68.0 in | Wt 142.8 lb

## 2020-09-04 DIAGNOSIS — R339 Retention of urine, unspecified: Secondary | ICD-10-CM | POA: Diagnosis not present

## 2020-09-04 LAB — BLADDER SCAN AMB NON-IMAGING

## 2020-09-04 NOTE — Progress Notes (Signed)
Fill and Pull Catheter Removal  Patient is present today for a catheter removal.  Patient was cleaned and prepped in a sterile fashion of sterile water was instilled into the bladder when the patient felt the urge to urinate. 69ml of water was then drained from the balloon.  A 16FR coude foley cath was removed from the bladder no complications were noted .  Patient was then given some time to void on their own.  Patient can void  on their own after some time.  Patient tolerated well.  Performed by: Carman Ching, PA-C   Follow up/ Additional notes: Push fluids and RTC this afternoon for PVR.

## 2020-09-04 NOTE — Progress Notes (Signed)
09/04/2020 1:27 PM   [name] 03-09-1951 [id number]  CC: Chief Complaint  Patient presents with  . Urinary Retention    HPI: Arthur Wilcox is a 69 y.o. 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  He was seen most recently in clinic by Dr. Richardo Hanks on 08/27/2020 for outpatient voiding trial, which he failed.  Foley catheter was replaced in the afternoon and he was started on Flomax 0.4 mg daily at that time.  Today he reports feeling well. He has been taking Flomax as prescribed. He denies dizziness or lightheadedness.  PMH: Past Medical History:  Diagnosis Date  . Anxiety   . Chronic back pain   . Chronic back pain    pinced nerve  . Chronic neck pain    pinched nerve  . Depression    doesn't require meds  . Essential hypertension, benign    takes Lisinopril daily but forgets on occasion  . Headache(784.0)   . History of foot fracture   . Hyperlipidemia    hx of;has been off of Lipitor 6-44yrs   . Joint disease   . Migraine    last one a week ago  . Osteoarthritis   . Seizures (HCC)    has been off of Dilantin 69yr;last seizure 60yrs ago  . Shortness of breath    with exertion    Surgical History: Past Surgical History:  Procedure Laterality Date  . LUMBAR LAMINECTOMY/DECOMPRESSION MICRODISCECTOMY  05/29/2012   Procedure: LUMBAR LAMINECTOMY/DECOMPRESSION MICRODISCECTOMY 2 LEVELS;  Surgeon: Temple Pacini, MD;  Location: [facility];  Service: Neurosurgery;  Laterality: Bilateral;  Bilateral Lumbar three through five decompressive lumbar laminectomy   . LUMBAR WOUND DEBRIDEMENT  06/19/2012   Procedure: LUMBAR WOUND DEBRIDEMENT;  Surgeon: Temple Pacini, MD;  Location: [facility];  Service: Neurosurgery;  Laterality: N/A;  Irrigation and Debridement of Lumbar Wound  . POSTERIOR CERVICAL LAMINECTOMY  04/18/2012   Procedure: POSTERIOR CERVICAL LAMINECTOMY;  Surgeon: Temple Pacini, MD;   Location: [facility];  Service: Neurosurgery;  Laterality: Right;  Right Cervical Seven-thoracic One Lamicectomy/Foraminotomy/Microdiscectomy    Home Medications:  Allergies as of 09/04/2020   No Known Allergies     Medication List       Accurate as of September 04, 2020  1:27 PM. If you have any questions, ask your nurse or doctor.        amLODipine 5 MG tablet Commonly known as: NORVASC Take 5 mg by mouth daily.   atorvastatin 80 MG tablet Commonly known as: LIPITOR Take 80 mg by mouth daily.   calcium carbonate 600 MG Tabs tablet Commonly known as: Calcium 600 Take 1 tablet (600 mg total) by mouth 2 (two) times daily with a meal for 30 days.   cyclobenzaprine 10 MG tablet Commonly known as: FLEXERIL Take 10 mg by mouth at bedtime as needed. For pain   meloxicam 15 MG tablet Commonly known as: MOBIC Take 15 mg by mouth daily.   naproxen sodium 220 MG tablet Commonly known as: ALEVE Take 220 mg by mouth daily as needed.   sildenafil 100 MG tablet Commonly known as: VIAGRA Take 100 mg by mouth daily as needed for erectile dysfunction.   tamsulosin 0.4 MG Caps capsule Commonly known as: FLOMAX Take 1 capsule (0.4 mg total) by mouth daily.       Allergies:  No Known Allergies  Family History: Family History  Problem  Relation Age of Onset  . Hypertension Mother   . Stroke Mother   . Diabetes type II Brother   . Coronary artery disease Brother   . Diabetes type II Sister   . Anesthesia problems Neg Hx   . Hypotension Neg Hx   . Malignant hyperthermia Neg Hx   . Pseudochol deficiency Neg Hx     Social History:   reports that he has been smoking cigarettes and cigars. He has a 22.00 pack-year smoking history. He has never used smokeless tobacco. He reports current alcohol use of about 1.0 standard drink of alcohol per week. He reports current drug use. Drug: Marijuana.  Physical Exam: BP (!) 176/75 (BP Location: Left Arm, Patient Position: Sitting,  Cuff Size: Normal)   Pulse 79   Ht 5\' 8"  (1.727 m)   Wt 142 lb 12.8 oz (64.8 kg)   BMI 21.71 kg/m   Constitutional:  Alert and oriented, no acute distress, nontoxic appearing HEENT: [place], AT Cardiovascular: No clubbing, cyanosis, or edema Respiratory: Normal respiratory effort, no increased work of breathing Skin: No rashes, bruises or suspicious lesions Neurologic: Grossly intact, no focal deficits, moving all 4 extremities Psychiatric: Normal mood and affect  Laboratory Data: Results for orders placed or performed in visit on 09/04/20  BLADDER SCAN AMB NON-IMAGING  Result Value Ref Range   Scan Result 09/06/20    Assessment & Plan:   1. Urinary retention Fill and pull voiding trial completed in the AM; see separate procedure note for details. Patient returned to clinic this afternoon for repeat PVR. He reports drinking approximately 24oz of fluid. He has been able to urinate, but notes some hesitancy and spraying stream. PVR .  Voiding trial passed. Counseled patient to continue Flomax. Will plan for repeat PVR tomorrow afternoon. Reviewed return precautions including low volume urinary output, difficulty urinating, lower abdominal pain, and abdominal distention. He expressed understanding. - BLADDER SCAN AMB NON-IMAGING   Return in about 1 day (around 09/05/2020) for Repeat PVR.  11/05/2020, PA-C  [facility] [address], [address] [place], [place] [id number]

## 2020-09-05 ENCOUNTER — Ambulatory Visit (INDEPENDENT_AMBULATORY_CARE_PROVIDER_SITE_OTHER): Payer: Medicare Other | Admitting: Physician Assistant

## 2020-09-05 ENCOUNTER — Encounter: Payer: Self-pay | Admitting: Physician Assistant

## 2020-09-05 VITALS — BP 148/81 | HR 109 | Ht 68.0 in | Wt 142.0 lb

## 2020-09-05 DIAGNOSIS — R339 Retention of urine, unspecified: Secondary | ICD-10-CM | POA: Diagnosis not present

## 2020-09-05 LAB — BLADDER SCAN AMB NON-IMAGING: Scan Result: 82

## 2020-09-05 NOTE — Progress Notes (Signed)
Patient returned to clinic this afternoon for repeat PVR following successful voiding trial yesterday.  PVR today 82 mL.  He reports some ongoing urinary hesitancy but overall feels he is emptying well.  He continues to take Flomax.  Counseled patient to continue Flomax and keep his scheduled follow-up with Dr. Richardo Hanks.  Counseled him to contact our office in the interim if he develops any new difficulty urinating.  He expressed understanding.  Results for orders placed or performed in visit on 09/05/20  Bladder Scan (Post Void Residual) in office  Result Value Ref Range   Scan Result 82

## 2020-10-02 ENCOUNTER — Ambulatory Visit: Payer: Self-pay | Admitting: Urology

## 2020-10-03 ENCOUNTER — Encounter: Payer: Self-pay | Admitting: Urology

## 2020-10-25 IMAGING — CR DG CERVICAL SPINE WITH FLEX & EXTEND
1 series · 7 of 7 positions shown · non-contrast
Comparison: 04/18/2012 and MRI, 01/13/2012.

CLINICAL DATA: Pt reports pain in his lower back, neck, bilateral
shoulders, and bilateral hands.

Hx osteoarthritis, joint disease
EXAM:
CERVICAL SPINE COMPLETE WITH FLEXION AND EXTENSION VIEWS

[Series 1: dg cervical spine with flex & extend · 0.14mm/px · 7 of 7 slices shown]
[im 1/7]
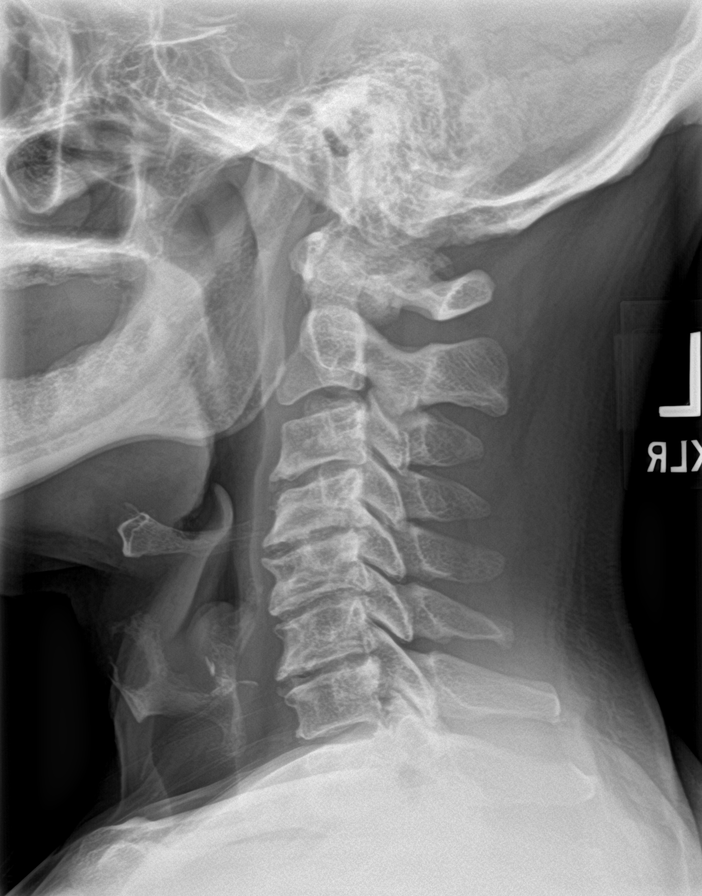
[im 2/7]
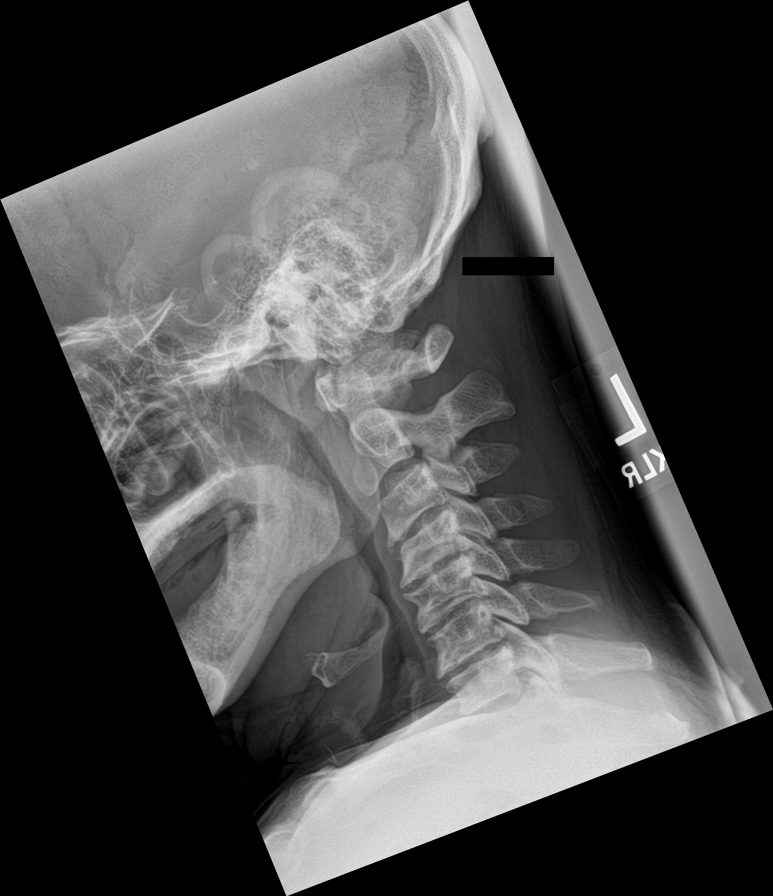
[im 3/7]
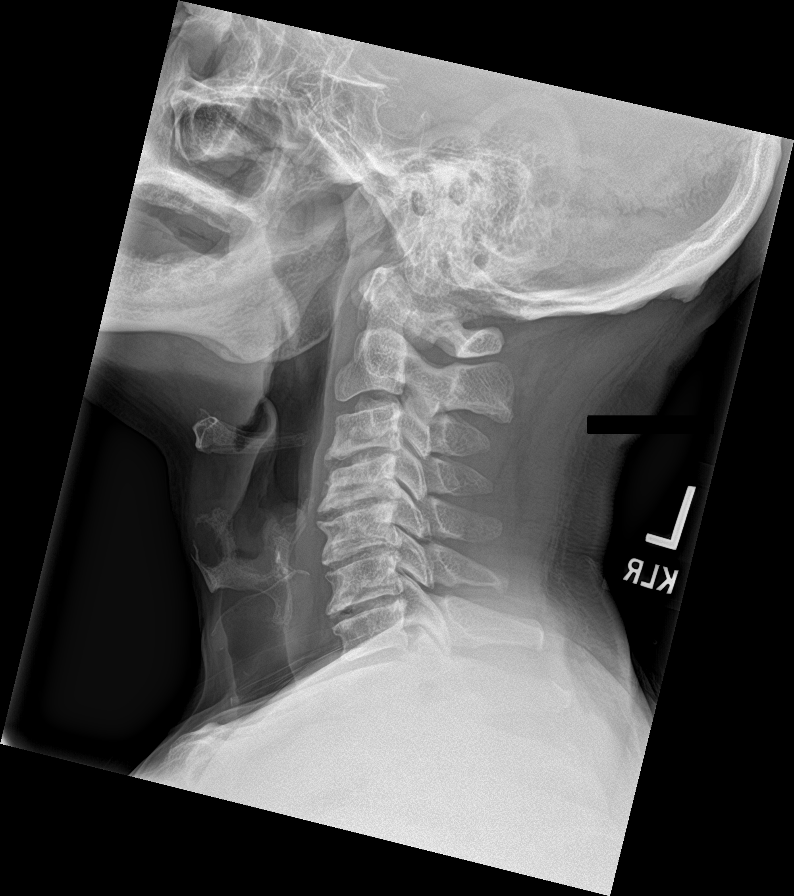
[im 4/7]
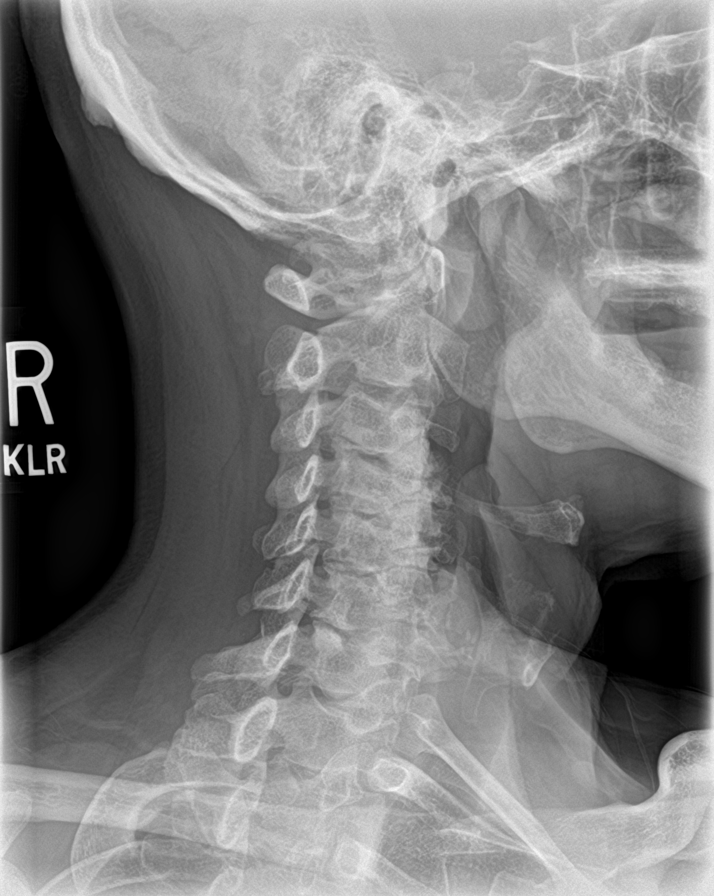
[im 5/7]
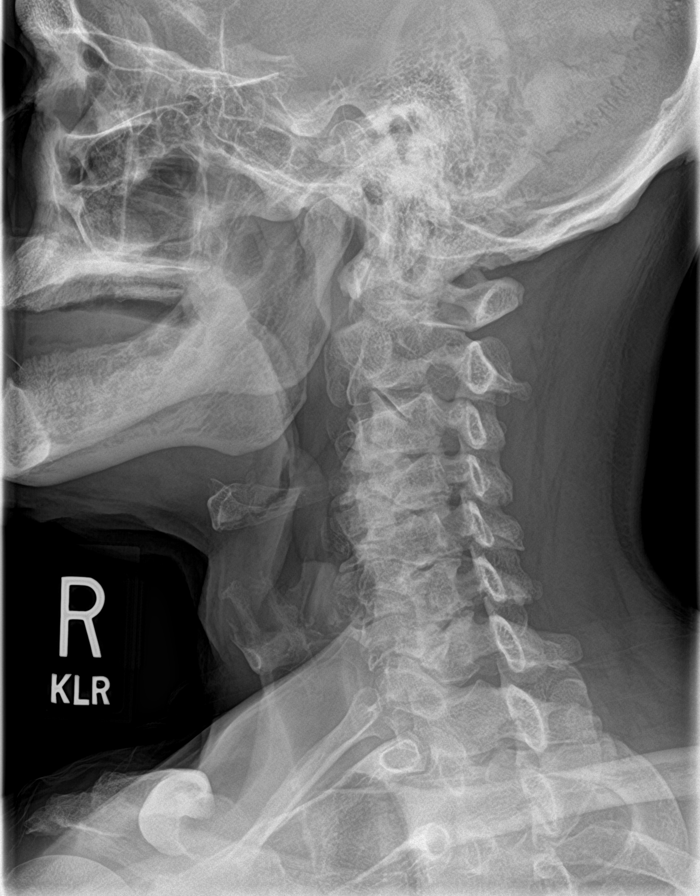
[im 6/7]
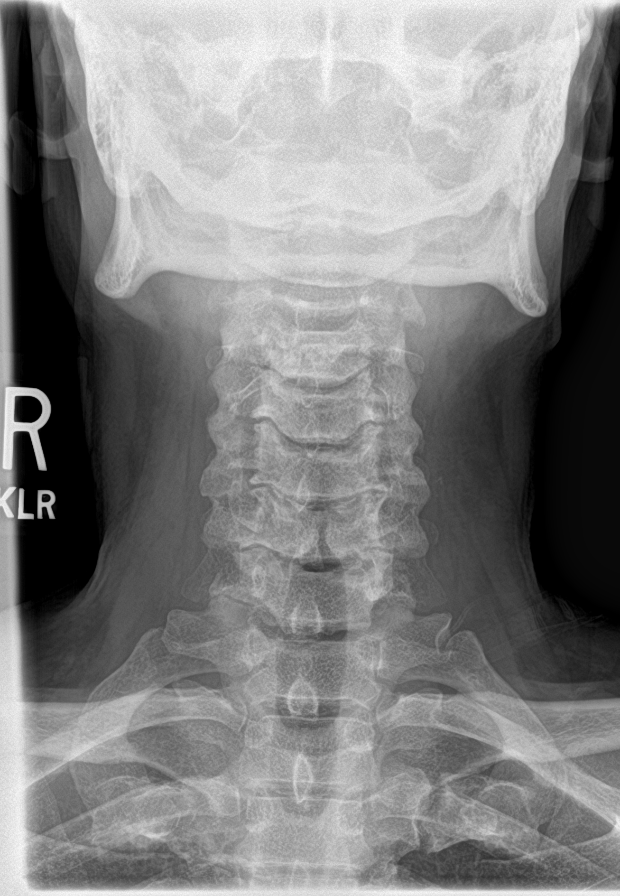
[im 7/7]
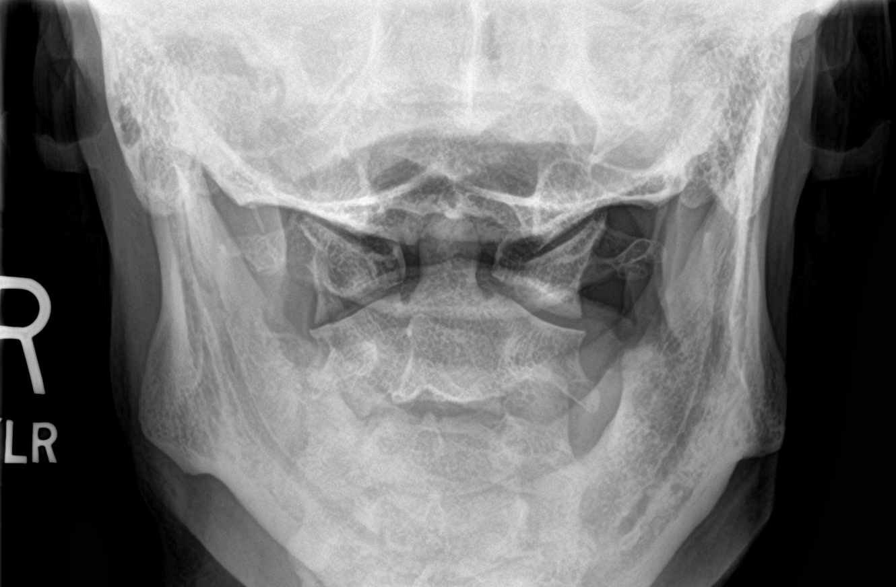

[7 of 7 positions shown; findings below may reference images not displayed]

FINDINGS: No fracture, bone lesion or spondylolisthesis.

Mild loss of disc height at C3-C4 with moderate loss of disc height
from C4-C5 through C6-C7. There are endplate osteophytes at these
levels with uncovertebral spurring causing at least mild degrees of
neural foraminal narrowing, greatest bilaterally at C5-C6.

Soft tissues are unremarkable.

Flexion and extension views show no subluxation.
IMPRESSION: 1. No fracture, spondylolisthesis or acute finding.
2. Disc degenerative changes from C3-C4 through C6-C7, which have
mildly progressed compared to the prior exam.
3. No subluxation with flexion or extension.

## 2020-10-25 IMAGING — CR DG LUMBAR SPINE COMPLETE W/ BEND
1 series · 7 of 7 positions shown · non-contrast
Comparison: Coronal and sagittal reconstructed images through the
lumbar spine from an abdominal and pelvic CT scan dated November 13, 2018

CLINICAL DATA: Arthralgias in multiple areas including the low
back. History of osteoarthritis.

EXAM:
LUMBAR SPINE - COMPLETE WITH BENDING VIEWS

[Series 1: dg lumbar spine complete w/bend 6+v · 0.14mm/px · 7 of 7 slices shown]
[im 1/7]
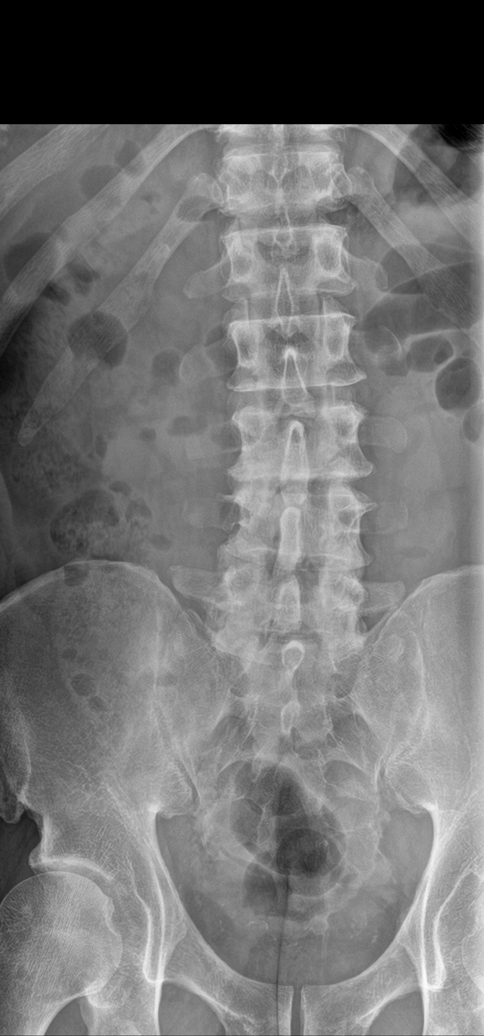
[im 2/7]
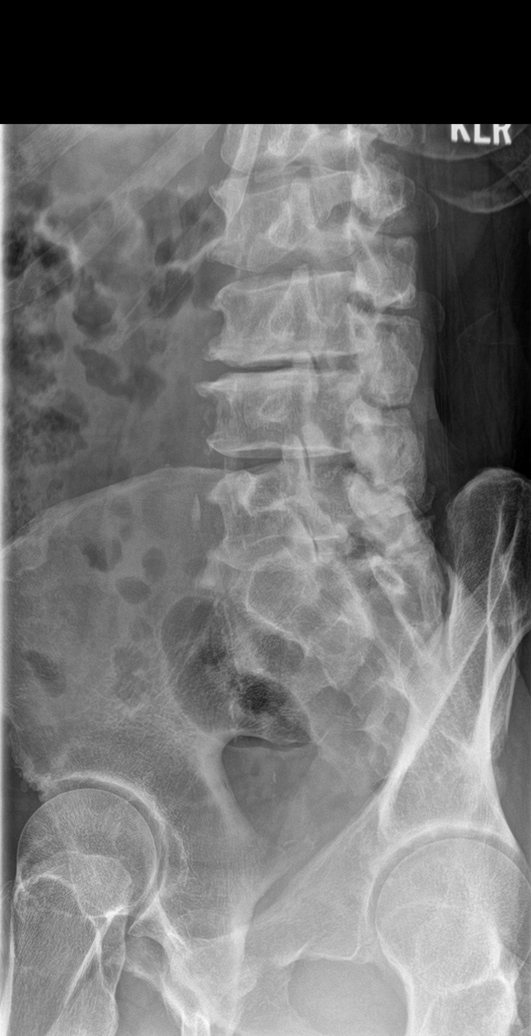
[im 3/7]
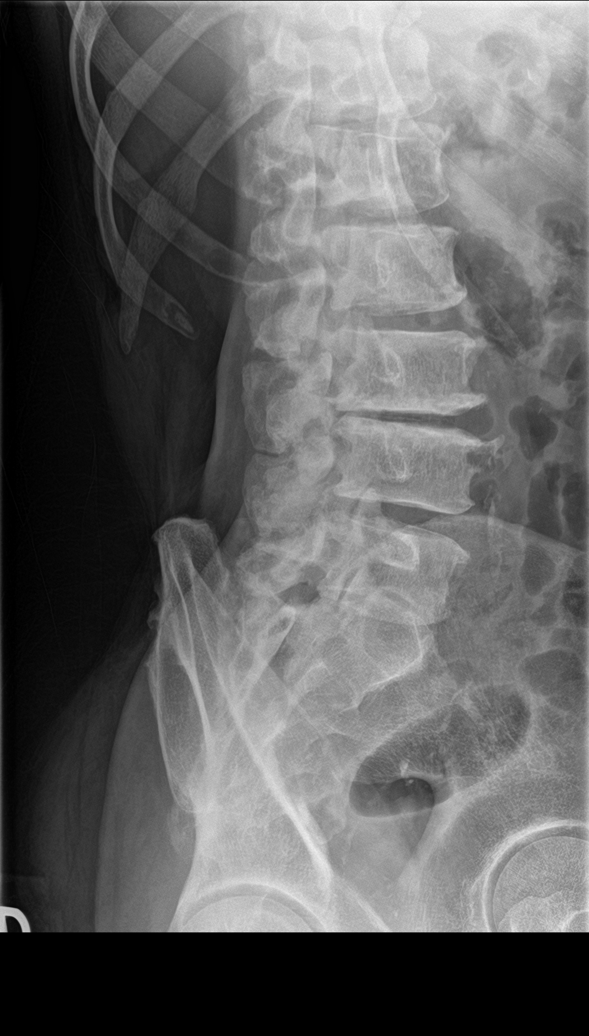
[im 4/7]
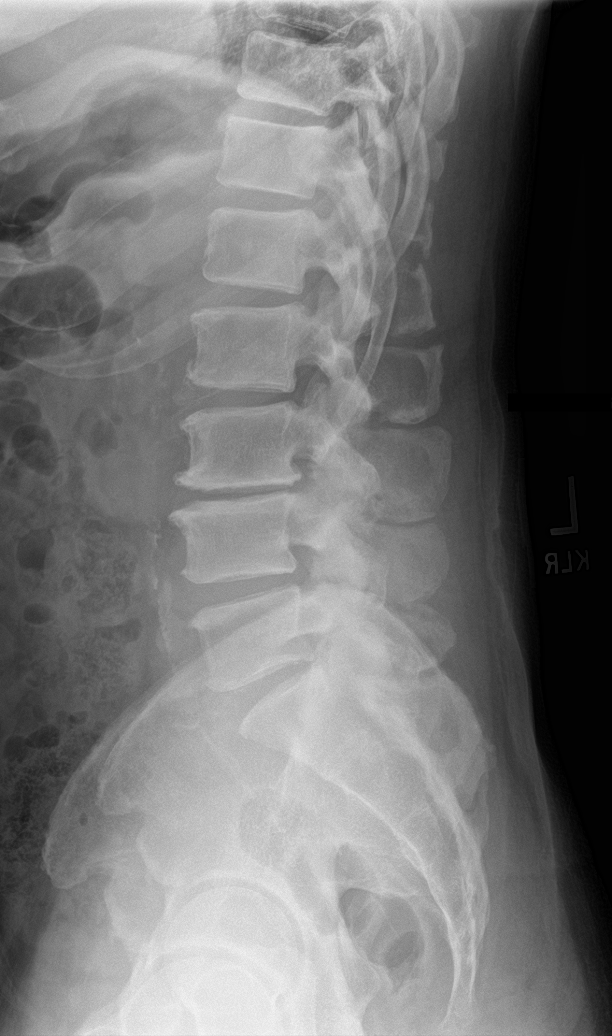
[im 5/7]
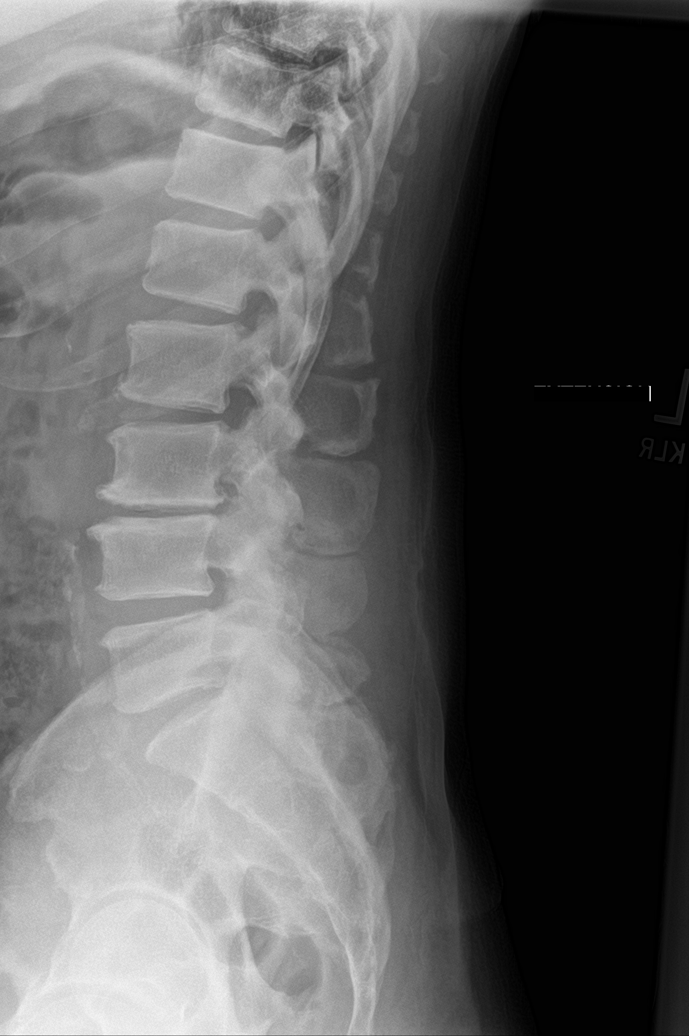
[im 6/7]
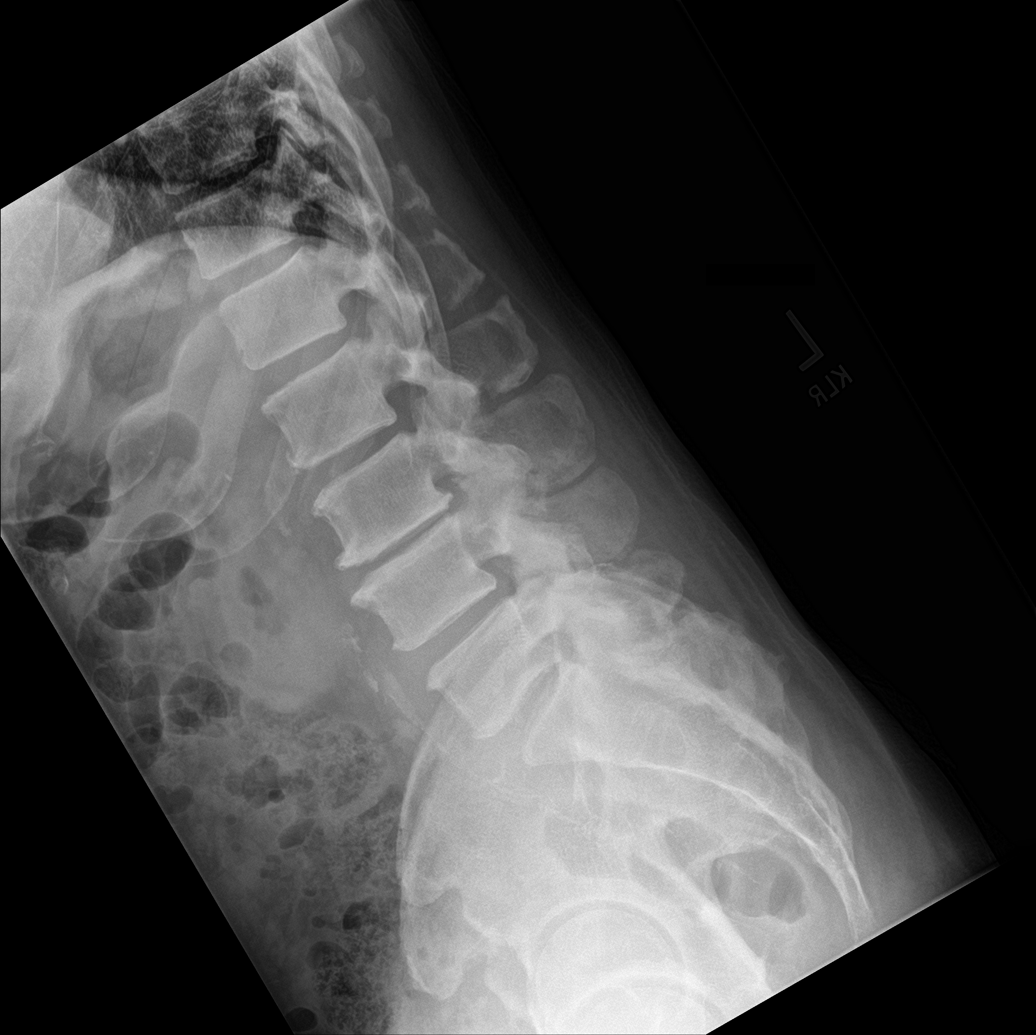
[im 7/7]
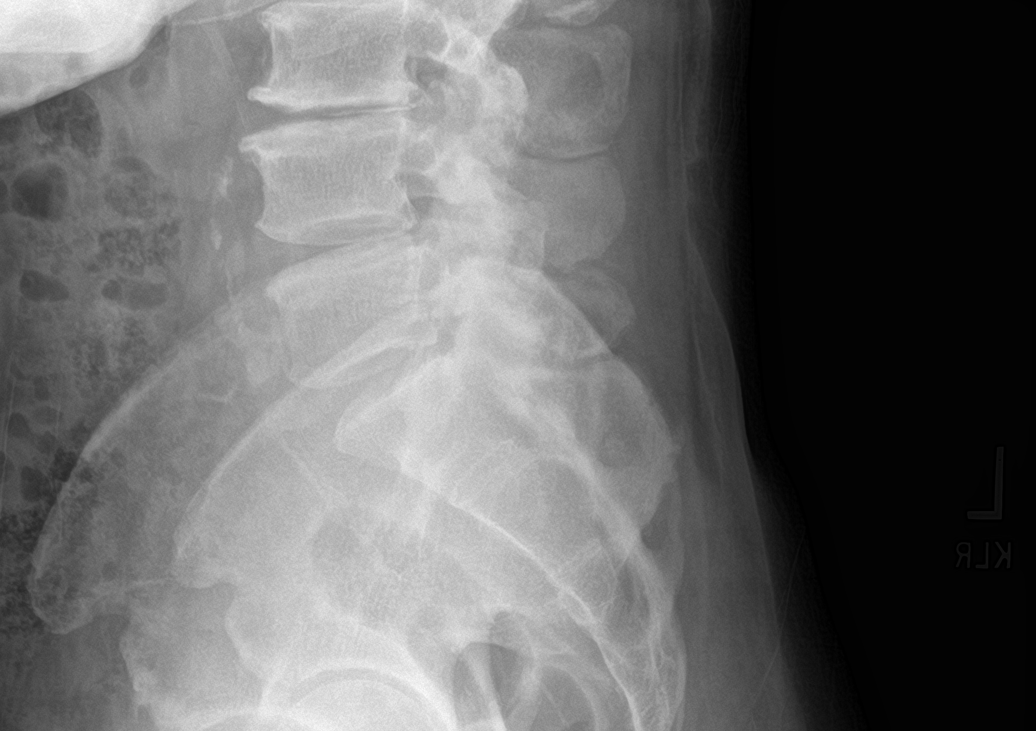

[7 of 7 positions shown; findings below may reference images not displayed]

FINDINGS: The lumbar vertebral bodies are preserved in height. There is
moderate disc space narrowing at L3-4 with milder narrowing at L4-5.
There is no spondylolisthesis. There are anterior and lateral
endplate osteophytes in the mid and lower lumbar spine. There is no
pars defect. There is facet joint hypertrophy from L3-4 through
L5-S1. The pedicles and transverse processes are intact.
IMPRESSION: Moderate degenerative disc disease at L3-4 and L4-5 with facet joint
degenerative change from L3 inferiorly. No compression fracture or
spondylolisthesis

## 2020-11-19 ENCOUNTER — Ambulatory Visit: Payer: Medicare Other | Admitting: Gastroenterology

## 2020-11-19 ENCOUNTER — Other Ambulatory Visit: Payer: Self-pay

## 2021-06-03 ENCOUNTER — Emergency Department (HOSPITAL_COMMUNITY)
Admission: EM | Admit: 2021-06-03 | Discharge: 2021-06-03 | Disposition: A | Discharge status: Home / Self Care | Payer: Medicare Other | Attending: Emergency Medicine | Admitting: Emergency Medicine

## 2021-06-03 ENCOUNTER — Other Ambulatory Visit: Payer: Self-pay

## 2021-06-03 ENCOUNTER — Emergency Department (HOSPITAL_COMMUNITY): Payer: Medicare Other

## 2021-06-03 ENCOUNTER — Encounter (HOSPITAL_COMMUNITY): Payer: Self-pay

## 2021-06-03 DIAGNOSIS — M5442 Lumbago with sciatica, left side: Secondary | ICD-10-CM

## 2021-06-03 DIAGNOSIS — F17210 Nicotine dependence, cigarettes, uncomplicated: Secondary | ICD-10-CM | POA: Insufficient documentation

## 2021-06-03 DIAGNOSIS — I10 Essential (primary) hypertension: Secondary | ICD-10-CM | POA: Insufficient documentation

## 2021-06-03 DIAGNOSIS — M79662 Pain in left lower leg: Secondary | ICD-10-CM | POA: Insufficient documentation

## 2021-06-03 DIAGNOSIS — Z79899 Other long term (current) drug therapy: Secondary | ICD-10-CM | POA: Diagnosis not present

## 2021-06-03 DIAGNOSIS — M25552 Pain in left hip: Secondary | ICD-10-CM | POA: Diagnosis not present

## 2021-06-03 DIAGNOSIS — M545 Low back pain, unspecified: Secondary | ICD-10-CM | POA: Diagnosis not present

## 2021-06-03 LAB — URINALYSIS, ROUTINE W REFLEX MICROSCOPIC
Bilirubin Urine: NEGATIVE
Glucose, UA: NEGATIVE mg/dL
Hgb urine dipstick: NEGATIVE
Ketones, ur: NEGATIVE mg/dL
Nitrite: NEGATIVE
Protein, ur: NEGATIVE mg/dL
Specific Gravity, Urine: 1.015 (ref 1.005–1.030)
WBC, UA: >50 WBC/hpf — ABNORMAL HIGH (ref 0–5)
pH: 6 (ref 5.0–8.0)

## 2021-06-03 LAB — I-STAT CHEM 8, ED
BUN: 12 mg/dL (ref 8–23)
Calcium, Ion: 1.24 mmol/L (ref 1.15–1.40)
Chloride: 102 mmol/L (ref 98–111)
Creatinine, Ser: 0.8 mg/dL (ref 0.61–1.24)
Glucose, Bld: 92 mg/dL (ref 70–99)
HCT: 45 % (ref 39.0–52.0)
Hemoglobin: 15.3 g/dL (ref 13.0–17.0)
Potassium: 3.9 mmol/L (ref 3.5–5.1)
Sodium: 141 mmol/L (ref 135–145)
TCO2: 29 mmol/L (ref 22–32)

## 2021-06-03 MED ORDER — PREDNISONE 10 MG PO TABS: ORAL_TABLET | ORAL | 0 refills | Status: AC

## 2021-06-03 MED ORDER — METHOCARBAMOL 500 MG PO TABS: 500.0000 mg | ORAL_TABLET | Freq: Three times a day (TID) | ORAL | 0 refills | Status: AC

## 2021-06-03 MED ORDER — OXYCODONE-ACETAMINOPHEN 5-325 MG PO TABS
1.0000 | ORAL_TABLET | Freq: Once | ORAL | Status: AC
Start: 2021-06-03 — End: 2021-06-03
  Administered 2021-06-03: 1 via ORAL
  Filled 2021-06-03: qty 1

## 2021-06-03 MED ORDER — METHOCARBAMOL 500 MG PO TABS
500.0000 mg | ORAL_TABLET | Freq: Once | ORAL | Status: AC
  Administered 2021-06-03: 500 mg via ORAL
  Filled 2021-06-03: qty 1

## 2021-06-03 MED ORDER — OXYCODONE-ACETAMINOPHEN 5-325 MG PO TABS: 1.0000 | ORAL_TABLET | ORAL | 0 refills | Status: AC | PRN

## 2021-06-03 MED ORDER — HYDROMORPHONE HCL 1 MG/ML IJ SOLN
1.0000 mg | Freq: Once | INTRAMUSCULAR | Status: AC
  Administered 2021-06-03: 1 mg via INTRAMUSCULAR
  Filled 2021-06-03: qty 1

## 2021-06-03 NOTE — ED Triage Notes (Signed)
Pt presents to ED via [facility] for lower back pain radiating down left leg. Pt with history of sugery on L4/L5 and bulging discs below L5. Pt states pain started on Sunday.

## 2021-06-03 NOTE — ED Notes (Signed)
Pt verbalized he was washing his face  Sunday and heard a pop in his back and has been hurting since.

## 2021-06-03 NOTE — ED Provider Notes (Signed)
[facility] EMERGENCY DEPARTMENT Provider Note   CSN: [id number] Arrival date & time: 06/03/21  1055     History Chief Complaint  Patient presents with   Back Pain    Arthur Wilcox is a 70 y.o. male.   Back Pain Associated symptoms: no abdominal pain, no chest pain, no dysuria, no fever, no numbness and no weakness        Arthur Wilcox is a 70 y.o. male with past medical history of chronic low back pain, chronic neck pain and hypertension presents to the Emergency Department complaining of gradually worsening low back pain x3 days.  He states that he was bending over the counter to wash his face when he noticed a pulling sensation to his lower back.  He describes sharp, stinging pains of his lower back that radiate to his left buttock, hip, and down the lateral side of his left leg to the level of his calf.  Pain is constant with intermittent sharp stabbing type pains.  Pain is worse with movement.  He is taking Naprosyn without relief.  Denies any fever, chills, abdominal pain, urine or bowel changes, numbness or weakness of his extremities.  No recent injuries or heavy lifting.    Past Medical History:  Diagnosis Date   Anxiety    Chronic back pain    Chronic back pain    pinced nerve   Chronic neck pain    pinched nerve   Depression    doesn't require meds   Essential hypertension, benign    takes Lisinopril daily but forgets on occasion   Headache(784.0)    History of foot fracture    Hyperlipidemia    hx of;has been off of Lipitor 6-51yrs    Joint disease    Migraine    last one a week ago   Osteoarthritis    Seizures (HCC)    has been off of Dilantin 44yr;last seizure 35yrs ago   Shortness of breath    with exertion    Patient Active Problem List   Diagnosis Date Noted   Lumbosacral foraminal stenosis (Bilateral: L3-4 & L4-5; Right: L4-5; Left: L5-S1 07/24/2019   Lumbar lateral recess stenosis (R:L3-4: L:L5-S1) 07/24/2019   Chronic low back pain (Primary area of  Pain) (Bilateral) (R>L) w/o sciatica 01/24/2019   Abnormal MRI, lumbar spine (01/15/2019) 01/24/2019   Osteoarthritis of hip (Right) 01/04/2019   Chronic upper extremity pain (Fourth Area of Pain) (Bilateral) (R>L) 12/25/2018   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   History of osteomyelitis of the Lumbar Spine 12/25/2018    Class: History of   History of CNS infection (Epidural Abscess) 12/25/2018    Class: History of   Lumbar spondylitis (HCC) 12/25/2018   Trigger thumb (Bilateral) 12/25/2018   Other intervertebral disc degeneration, lumbar region 12/25/2018   Chronic hip pain (Bilateral (R>L) 12/25/2018   Chronic groin pain (Right) 12/25/2018   Cocaine use 12/18/2018   Marijuana use 12/18/2018   Elevated C-reactive protein (CRP) 12/08/2018   Elevated sed rate 12/08/2018   Vitamin D deficiency 12/08/2018   Chronic low back pain (Primary Area of Pain) (Bilateral) (R>L) w/ sciatica (Right) 12/07/2018   Chronic lower extremity pain (Secondary Area of Pain) (Right) 12/07/2018   Chronic shoulder pain (Tertiary Area of Pain) (Bilateral) (R>L) 12/07/2018   Hand pain (Bilateral) (R>L) 12/07/2018   Chronic neck pain (Bilateral) (R>L) 12/07/2018   Chronic pain syndrome 12/07/2018   Pharmacologic therapy 12/07/2018   Disorder of skeletal system 12/07/2018   Problems  influencing health status 12/07/2018   Chronic sacroiliac joint pain (Bilateral) (R>L) 12/07/2018   Wound infection after surgery 06/20/2012   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 05/29/2012   Herniation of cervical intervertebral disc with radiculopathy 04/18/2012   Preoperative evaluation to rule out surgical contraindication 02/23/2012   Abnormal ECG 02/23/2012   Essential hypertension, benign 02/23/2012   Shortness of breath 02/23/2012   Tobacco abuse 02/23/2012    Past Surgical History:  Procedure Laterality Date   LUMBAR LAMINECTOMY/DECOMPRESSION MICRODISCECTOMY  05/29/2012   Procedure:  LUMBAR LAMINECTOMY/DECOMPRESSION MICRODISCECTOMY 2 LEVELS;  Surgeon: Temple PaciniHenry A Pool, MD;  Location: [facility];  Service: Neurosurgery;  Laterality: Bilateral;  Bilateral Lumbar three through five decompressive lumbar laminectomy    LUMBAR WOUND DEBRIDEMENT  06/19/2012   Procedure: LUMBAR WOUND DEBRIDEMENT;  Surgeon: Temple PaciniHenry A Pool, MD;  Location: [facility];  Service: Neurosurgery;  Laterality: N/A;  Irrigation and Debridement of Lumbar Wound   POSTERIOR CERVICAL LAMINECTOMY  04/18/2012   Procedure: POSTERIOR CERVICAL LAMINECTOMY;  Surgeon: Temple PaciniHenry A Pool, MD;  Location: [facility];  Service: Neurosurgery;  Laterality: Right;  Right Cervical Seven-thoracic One Lamicectomy/Foraminotomy/Microdiscectomy       Family History  Problem Relation Age of Onset   Hypertension Mother    Stroke Mother    Diabetes type II Brother    Coronary artery disease Brother    Diabetes type II Sister    Anesthesia problems Neg Hx    Hypotension Neg Hx    Malignant hyperthermia Neg Hx    Pseudochol deficiency Neg Hx     Social History   Tobacco Use   Smoking status: Every Day    Packs/day: 0.50    Years: 44.00    Pack years: 22.00    Types: Cigarettes, Cigars   Smokeless tobacco: Never  Substance Use Topics   Alcohol use: Yes    Alcohol/week: 1.0 standard drink    Types: 1 Cans of beer per week    Comment: Regular intake-gin   Drug use: Yes    Types: Marijuana    Comment: 04/09/12    Home Medications Prior to Admission medications   Medication Sig Start Date End Date Taking? Authorizing Provider  amLODipine (NORVASC) 5 MG tablet Take 5 mg by mouth daily. 07/31/20   [provider]  atorvastatin (LIPITOR) 80 MG tablet Take 80 mg by mouth daily. 03/10/20   [provider]  calcium carbonate (CALCIUM 600) 600 MG TABS tablet Take 1 tablet (600 mg total) by mouth 2 (two) times daily with a meal for 30 days. 12/25/18 07/24/19  Delano MetzNaveira, Francisco, MD  cyclobenzaprine (FLEXERIL) 10 MG  tablet Take 10 mg by mouth at bedtime as needed. For pain    [provider]  ENTRESTO 24-26 MG Take 1 tablet by mouth 2 (two) times daily. 10/17/20   [provider]  meloxicam (MOBIC) 15 MG tablet Take 15 mg by mouth daily.    [provider]  naproxen sodium (ALEVE) 220 MG tablet Take 220 mg by mouth daily as needed.    [provider]  prasugrel (EFFIENT) 10 MG TABS tablet Take 10 mg by mouth daily. 10/17/20   [provider]  sildenafil (VIAGRA) 100 MG tablet Take 100 mg by mouth daily as needed for erectile dysfunction.    [provider]  tamsulosin (FLOMAX) 0.4 MG CAPS capsule Take 1 capsule (0.4 mg total) by mouth daily. 08/27/20   Sondra ComeSninsky, Brian C, MD    Allergies  Patient has no known allergies.  Review of Systems   Review of Systems  Constitutional:  Negative for fever.  Respiratory:  Negative for shortness of breath.   Cardiovascular:  Negative for chest pain and leg swelling.  Gastrointestinal:  Negative for abdominal pain, constipation and vomiting.  Genitourinary:  Negative for decreased urine volume, difficulty urinating, dysuria, flank pain and hematuria.  Musculoskeletal:  Positive for back pain (low back pain, sharp left leg pain). Negative for joint swelling.  Skin:  Negative for color change, rash and wound.  Neurological:  Negative for syncope, weakness and numbness.   Physical Exam Updated Vital Signs BP 125/60 (BP Location: Left Arm)   Pulse (!) 48   Temp 98 F (36.7 C) (Oral)   Resp 16   Ht 5\' 8"  (1.727 m)   Wt 71.2 kg   SpO2 99%   BMI 23.87 kg/m   Physical Exam Vitals and nursing note reviewed.  Constitutional:      Appearance: Normal appearance. He is not ill-appearing or toxic-appearing.  HENT:     Head: Normocephalic.  Eyes:     Pupils: Pupils are equal, round, and reactive to light.  Neck:     Thyroid: No thyromegaly.     Meningeal: Kernig's sign absent.  Cardiovascular:     Rate and  Rhythm: Normal rate and regular rhythm.     Pulses: Normal pulses.     Heart sounds: Normal heart sounds.  Pulmonary:     Effort: Pulmonary effort is normal.     Breath sounds: Normal breath sounds. No wheezing.  Abdominal:     Palpations: Abdomen is soft.     Tenderness: There is no abdominal tenderness. There is no guarding or rebound.  Musculoskeletal:        General: Tenderness present. No swelling or signs of injury. Normal range of motion.     Cervical back: Normal range of motion and neck supple.     Lumbar back: Tenderness present. No swelling. Normal range of motion.     Right lower leg: No edema.     Left lower leg: No edema.     Comments: Tenderness to palpation of the lower lumbar spine and left paraspinal muscles.  Mild tenderness noted over the left SI joint space.  5 out of 5 strength the bilateral lower extremities.  Skin:    General: Skin is warm and dry.     Capillary Refill: Capillary refill takes less than 2 seconds.     Findings: No rash.  Neurological:     General: No focal deficit present.     Mental Status: He is alert and oriented to person, place, and time.     Sensory: No sensory deficit.     Motor: No weakness.    ED Results / Procedures / Treatments   Labs (all labs ordered are listed, but only abnormal results are displayed) Labs Reviewed  URINALYSIS, ROUTINE W REFLEX MICROSCOPIC - Abnormal; Notable for the following components:      Result Value   APPearance HAZY (*)    Leukocytes,Ua MODERATE (*)    WBC, UA >50 (*)    Bacteria, UA RARE (*)    All other components within normal limits  URINE CULTURE  I-STAT CHEM 8, ED    EKG None  Radiology DG Lumbar Spine Complete  Result Date: 06/03/2021 CLINICAL DATA:  back pain after bending over EXAM: LUMBAR SPINE - COMPLETE 4+ VIEW COMPARISON:  12/07/2018 FINDINGS: There is no evidence of fracture.  There is mild to moderate multilevel degenerative disc disease, worst at L2-L3 and L3-L4, slightly  progressed since prior exam in 2020. There is moderate lower lumbar predominant facet arthritis. Straightening of the normal lumbar lordosis. There is no significant listhesis. IMPRESSION: Mild-to-moderate multilevel degenerative disc disease, worst at L2-L3 and L3-L4, slightly progressed since prior exam in 2020. Moderate lower lumbar predominant facet arthritis. No evidence of fracture. Electronically Signed   By: Caprice Renshaw   On: 06/03/2021 13:56    Procedures Procedures   Medications Ordered in ED Medications  oxyCODONE-acetaminophen (PERCOCET/ROXICET) 5-325 MG per tablet 1 tablet (1 tablet Oral Given 06/03/21 1306)  methocarbamol (ROBAXIN) tablet 500 mg (500 mg Oral Given 06/03/21 1306)  HYDROmorphone (DILAUDID) injection 1 mg (1 mg Intramuscular Given 06/03/21 1527)    ED Course  I have reviewed the triage vital signs and the nursing notes.  Pertinent labs & imaging results that were available during my care of the patient were reviewed by me and considered in my medical decision making (see chart for details).    MDM Rules/Calculators/A&P                          Patient with likely acute on chronic low back pain.  Symptoms began several days ago after bending over to wash his face.  Felt a pulling sensation of his left lower back.  Since that time he has had intermittent sharp pains of his left lower back that radiate to his buttocks into the level of his mid left lower leg.  Pain worsens with movement.  Symptoms likely secondary to sciatica.  On exam, he has pain with certain movements that improved at rest.  He has focal tenderness at the lower lumbar spine and paraspinal muscles.  He is ambulatory with slow but steady gait.  No evidence of kidney disease.  Urinalysis shows greater than 50 white cells and moderate leukocytes without nitrites.  Patient denies dysuria.  Will obtain urine culture. Clinical suspecion for UTI is low.  No recent spinal procedures to suggest spinal abscess.   No red flags for cauda equina.    On recheck, pt resting comfortably.  Pain has improved after IM medication.  I feel that he is appropriate for d/c home.  Pt is agreeable.  Will provide short course of pain medication, muscle relaxer and prednisone taper.  He has out pt f/u with PCP.  Return precautions discussed.     Final Clinical Impression(s) / ED Diagnoses Final diagnoses:  None    Rx / DC Orders ED Discharge Orders     None        Pauline Aus, PA-C 06/03/21 1704    Terald Sleeper, MD 06/04/21 1009

## 2021-06-03 NOTE — ED Notes (Signed)
Urinal at bedside for pt to get urine sample.

## 2021-06-03 NOTE — Discharge Instructions (Addendum)
Take the medication as directed.  You may alternate ice and heat to your lower back.  Stop taking the naproxen and meloxicam while taking the prednisone.  Follow-up with your primary care provider for recheck next week.  Return to the emergency department for any new or worsening symptoms.

## 2021-06-05 LAB — URINE CULTURE: Culture: >=100000 — AB

## 2021-06-06 ENCOUNTER — Telehealth: Payer: Self-pay | Admitting: Emergency Medicine

## 2021-06-06 NOTE — Telephone Encounter (Unsigned)
Post ED Visit - Positive Culture Follow-up: Unsuccessful Patient Follow-up  Culture assessed and recommendations reviewed by:  []  , Pharm.D. []  Enzo Bi, Pharm.D., BCPS AQ-ID []  , Pharm.D., BCPS []  Celedonio Miyamoto, .D., BCPS []  Girard, .D., BCPS, AAHIVP []  Georgina Pillion, Pharm.D., BCPS, AAHIVP [x]  [address], PharmD []  , PharmD, BCPS  Positive urine culture  [x]  Patient discharged without antimicrobial prescription and treatment is now indicated []  Organism is resistant to prescribed ED discharge antimicrobial []  Patient with positive blood cultures   Unable to contact patient at phone number on file - number not in service, letter will be sent to address on file  [place] 06/06/2021, 3:19 PM

## 2021-06-06 NOTE — Progress Notes (Signed)
ED Antimicrobial Stewardship Positive Culture Follow Up   Arthur Wilcox is an 70 y.o. male who presented to [facility] on 06/03/2021 with a chief complaint of  Chief Complaint  Patient presents with   Back Pain    Recent Results (from the past 720 hour(s))  Urine culture     Status: Abnormal   Collection Time: 06/03/21  1:45 PM   Specimen: Urine, Random  Result Value Ref Range Status   Specimen Description   Final    URINE, RANDOM Performed at [facility], [address] [place], [place] [address]    Special Requests   Final    NONE Performed at [facility], [address] [place], [place] [address]    Culture >=100,000 COLONIES/mL KLEBSIELLA PNEUMONIAE (A)  Final   Report Status 06/05/2021 FINAL  Final   Organism ID, Bacteria KLEBSIELLA PNEUMONIAE (A)  Final      Susceptibility   Klebsiella pneumoniae - MIC*    AMPICILLIN RESISTANT Resistant     CEFAZOLIN <=4 SENSITIVE Sensitive     CEFEPIME <=0.12 SENSITIVE Sensitive     CEFTRIAXONE <=0.25 SENSITIVE Sensitive     CIPROFLOXACIN <=0.25 SENSITIVE Sensitive     GENTAMICIN <=1 SENSITIVE Sensitive     IMIPENEM <=0.25 SENSITIVE Sensitive     NITROFURANTOIN 128 RESISTANT Resistant     TRIMETH/SULFA <=20 SENSITIVE Sensitive     AMPICILLIN/SULBACTAM 4 SENSITIVE Sensitive     PIP/TAZO <=4 SENSITIVE Sensitive     * >=100,000 COLONIES/mL KLEBSIELLA PNEUMONIAE    No antibiotics prescribed  New antibiotic prescription: cephalexin 500 mg BID x 7 days. Qty 14. 0 Refills.  ED Provider: A Augusto Garbe 06/06/2021, 12:15 PM Clinical Pharmacist [phone]
